# Patient Record
Sex: Female | Born: 1995 | Race: White | Hispanic: No | Marital: Single | Smoking: Never smoker
Health system: Southern US, Community
[De-identification: ages and names within clinical notes are randomized; demographics above are authoritative.]

## PROBLEM LIST (undated history)

## (undated) DIAGNOSIS — J4520 Mild intermittent asthma, uncomplicated: Secondary | ICD-10-CM

## (undated) DIAGNOSIS — F988 Other specified behavioral and emotional disorders with onset usually occurring in childhood and adolescence: Secondary | ICD-10-CM

## (undated) DIAGNOSIS — F319 Bipolar disorder, unspecified: Secondary | ICD-10-CM

## (undated) DIAGNOSIS — F32A Depression, unspecified: Secondary | ICD-10-CM

## (undated) DIAGNOSIS — F909 Attention-deficit hyperactivity disorder, unspecified type: Secondary | ICD-10-CM

## (undated) DIAGNOSIS — F329 Major depressive disorder, single episode, unspecified: Secondary | ICD-10-CM

## (undated) DIAGNOSIS — F419 Anxiety disorder, unspecified: Secondary | ICD-10-CM

## (undated) DIAGNOSIS — E119 Type 2 diabetes mellitus without complications: Secondary | ICD-10-CM

## (undated) DIAGNOSIS — I10 Essential (primary) hypertension: Secondary | ICD-10-CM

## (undated) HISTORY — DX: Morbid (severe) obesity due to excess calories: E66.01

## (undated) HISTORY — DX: Essential (primary) hypertension: I10

## (undated) HISTORY — DX: Mild intermittent asthma, uncomplicated: J45.20

## (undated) HISTORY — DX: Attention-deficit hyperactivity disorder, unspecified type: F90.9

## (undated) HISTORY — DX: Bipolar disorder, unspecified: F31.9

## (undated) HISTORY — DX: Type 2 diabetes mellitus without complications: E11.9

---
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
## 2015-01-17 ENCOUNTER — Ambulatory Visit (HOSPITAL_COMMUNITY)
Admission: RE | Admit: 2015-01-17 | Discharge: 2015-01-17 | Disposition: A | Discharge status: Home / Self Care | Payer: Medicaid Other | Source: Ambulatory Visit | Attending: Urology | Admitting: Urology

## 2015-01-29 ENCOUNTER — Other Ambulatory Visit: Payer: Self-pay | Admitting: Urology

## 2015-01-29 ENCOUNTER — Ambulatory Visit (HOSPITAL_COMMUNITY)
Admission: RE | Admit: 2015-01-29 | Discharge: 2015-01-29 | Disposition: A | Discharge status: Home / Self Care | Payer: Medicaid Other | Source: Ambulatory Visit | Attending: Urology | Admitting: Urology

## 2015-01-29 DIAGNOSIS — K769 Liver disease, unspecified: Secondary | ICD-10-CM

## 2015-02-06 ENCOUNTER — Ambulatory Visit (HOSPITAL_COMMUNITY)
Admission: RE | Admit: 2015-02-06 | Discharge: 2015-02-06 | Disposition: A | Discharge status: Home / Self Care | Payer: Medicaid Other | Source: Ambulatory Visit | Attending: Pediatrics | Admitting: Pediatrics

## 2015-02-06 DIAGNOSIS — Z0001 Encounter for general adult medical examination with abnormal findings: Secondary | ICD-10-CM | POA: Diagnosis not present

## 2015-02-06 DIAGNOSIS — K769 Liver disease, unspecified: Secondary | ICD-10-CM | POA: Diagnosis not present

## 2015-02-06 DIAGNOSIS — R6889 Other general symptoms and signs: Secondary | ICD-10-CM | POA: Insufficient documentation

## 2015-02-06 MED ORDER — GADOBENATE DIMEGLUMINE 529 MG/ML IV SOLN
20.0000 mL | Freq: Once | INTRAVENOUS | Status: AC | PRN
  Administered 2015-02-06: 20 mL via INTRAVENOUS

## 2015-05-08 ENCOUNTER — Encounter: Payer: Self-pay | Admitting: Endocrinology

## 2015-05-08 ENCOUNTER — Ambulatory Visit (INDEPENDENT_AMBULATORY_CARE_PROVIDER_SITE_OTHER): Payer: Medicaid Other | Admitting: Endocrinology

## 2015-05-08 VITALS — BP 148/97 | HR 113 | Temp 98.3°F | Ht 64.0 in | Wt 287.0 lb

## 2015-05-08 DIAGNOSIS — E119 Type 2 diabetes mellitus without complications: Secondary | ICD-10-CM | POA: Diagnosis not present

## 2015-05-08 MED ORDER — METFORMIN HCL ER 500 MG PO TB24: 2000.0000 mg | ORAL_TABLET | Freq: Every day | ORAL | Status: DC

## 2015-05-08 NOTE — Patient Instructions (Addendum)
Please come back for a follow-up appointment in 3-6 months.   The a1c of 6.4% on metformin means you do have diabetes.   Please increase the metformin.  i have sent a prescription to your pharmacy.

## 2015-05-08 NOTE — Progress Notes (Signed)
Subjective:    Patient ID: Aimee Sparks, female    DOB: 1995-12-31, 20 y.o.   MRN: [id number]  HPI  Pt returns for f/u of diabetes mellitus: DM type: 2 Dx'ed: 2016 Complications: none Therapy: metformin DKA: never Severe hypoglycemia: never Pancreatitis: never Other: she does not want a pregnancy now, but would like to preserve fertility for the future Interval history: pt states she feels well in general.  She takes metformin as rx'ed.  Past Medical History  Diagnosis Date  . ADD (attention deficit disorder)   . Depression   . Anxiety     No past surgical history on file.  Social History   Social History  . Marital Status: Single    Spouse Name: N/A  . Number of Children: N/A  . Years of Education: N/A   Occupational History  . Not on file.   Social History Main Topics  . Smoking status: Never Smoker   . Smokeless tobacco: Not on file  . Alcohol Use: No  . Drug Use: No  . Sexual Activity: Yes    Birth Control/ Protection: Pill   Other Topics Concern  . Not on file   Social History Narrative    Current Outpatient Prescriptions on File Prior to Visit  Medication Sig Dispense Refill  . acetaminophen (TYLENOL) 500 MG tablet Take 1,000 mg by mouth every 6 (six) hours as needed for pain.    . hydrOXYzine (ATARAX/VISTARIL) 10 MG tablet Take 1-3 tablets by mouth at bedtime as needed. itching  2  . lisdexamfetamine (VYVANSE) 70 MG capsule Take 70 mg by mouth every morning.    . loratadine (CLARITIN) 10 MG tablet Take 10 mg by mouth every morning.    . simethicone (MYLICON) 80 MG chewable tablet Chew 160 mg by mouth every 6 (six) hours as needed for flatulence.    . tretinoin (RETIN-A) 0.05 % cream Apply 1 application topically every morning.  2  . ziprasidone (GEODON) 40 MG capsule Take 1 capsule by mouth every evening.  3  . zonisamide (ZONEGRAN) 100 MG capsule Take 300 mg by mouth at bedtime.     . bismuth subsalicylate (PEPTO BISMOL) 262 MG chewable tablet  Chew 524 mg by mouth as needed for indigestion. Reported on 05/08/2015    . drospirenone-ethinyl estradiol (YAZ,GIANVI,LORYNA) 3-0.02 MG tablet Take 1 tablet by mouth daily. Reported on 05/08/2015    . sertraline (ZOLOFT) 100 MG tablet Take 100 mg by mouth daily. Reported on 05/08/2015     No current facility-administered medications on file prior to visit.    No Known Allergies  Family History  Problem Relation Age of Onset  . Diabetes Father     BP 148/97 mmHg  Pulse 113  Temp(Src) 98.3 F (36.8 C) (Oral)  Ht  (1.626 m)  Wt 287 lb (130.182 kg)  BMI 49.24 kg/m2  SpO2 98%  Review of Systems No weight change.  She has dry skin.      Objective:   Physical Exam VITAL SIGNS:  See vs page GENERAL: no distress NECK: acanthotic skin. Pulses: dorsalis pedis intact bilat.   MSK: no deformity of the feet CV: 1+ bilat leg edema Skin:  no ulcer on the feet, but the skin is dry.  normal color and temp on the feet. Neuro: sensation is intact to touch on the feet.    outside test results are reviewed: A1c=6.4% TSH=1  24-HR urine cortisol=17 mcg    Assessment & Plan:  DM: she needs  increased rx, if it can be done with a regimen that avoids or minimizes hypoglycemia.  Obesity, persistent.  The normal blood and urine cortisol exclude cushing's.   Patient is advised the following: Patient Instructions  Please come back for a follow-up appointment in 3-6 months.   The a1c of 6.4% on metformin means you do have diabetes.   Please increase the metformin.  i have sent a prescription to your pharmacy.

## 2015-05-10 DIAGNOSIS — E119 Type 2 diabetes mellitus without complications: Secondary | ICD-10-CM | POA: Insufficient documentation

## 2015-06-05 ENCOUNTER — Encounter: Payer: Self-pay | Admitting: Endocrinology

## 2015-09-05 ENCOUNTER — Ambulatory Visit: Payer: Medicaid Other | Admitting: Endocrinology

## 2015-09-05 DIAGNOSIS — Z0289 Encounter for other administrative examinations: Secondary | ICD-10-CM

## 2015-09-06 ENCOUNTER — Telehealth: Payer: Self-pay | Admitting: Endocrinology

## 2015-09-06 NOTE — Telephone Encounter (Signed)
Please come back for a follow-up appointment in 3 months 

## 2015-09-06 NOTE — Telephone Encounter (Signed)
Patient no showed today's appt. Please advise on how to follow up. °A. No follow up necessary. °B. Follow up urgent. Contact patient immediately. °C. Follow up necessary. Contact patient and schedule visit in ___ days. °D. Follow up advised. Contact patient and schedule visit in ____weeks. ° °

## 2015-09-07 NOTE — Telephone Encounter (Signed)
Called to R/S no answer

## 2016-02-14 IMAGING — MR MR ABDOMEN WO/W CM
12 of 21 series · 21 of 48 positions shown · IV contrast (Yes   MH)
Comparison: CT abdomen pelvis dated 11/09/2014

CLINICAL DATA: Liver lesions on CT

EXAM:
MRI ABDOMEN WITHOUT AND WITH CONTRAST
TECHNIQUE: Multiplanar multisequence MR imaging of the abdomen was performed
both before and after the administration of intravenous contrast.
CONTRAST:  20mL MULTIHANCE GADOBENATE DIMEGLUMINE 529 MG/ML IV SOLN

[Series 4: cor ssfse nav · coronal · 6.0mm · 0.78mm/px · 1 of 32 slices shown]
[im 1/32]
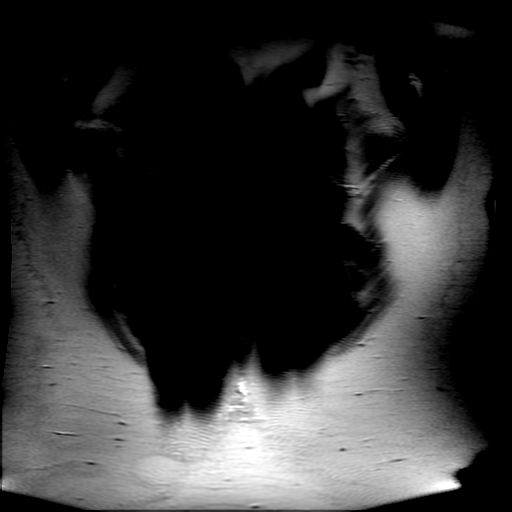

[Series 6: ax ssfse nav · axial · 6.0mm · 0.74mm/px · 1 of 38 slices shown]
[im 1/38]
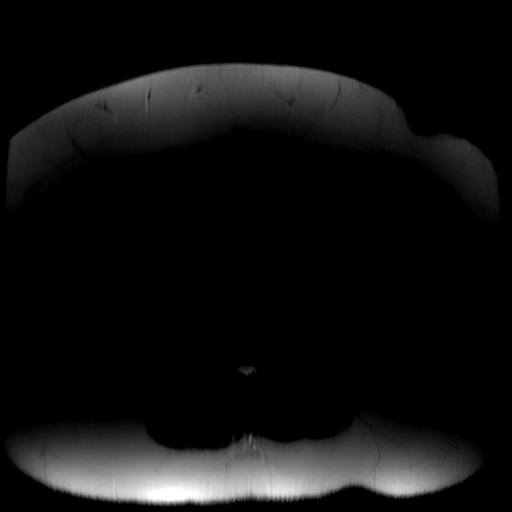

[Series 7: T2 fat-sat · axial · 6.0mm · 0.74mm/px · 1 of 19 slices shown (1 of 2)]
[im 1/19]
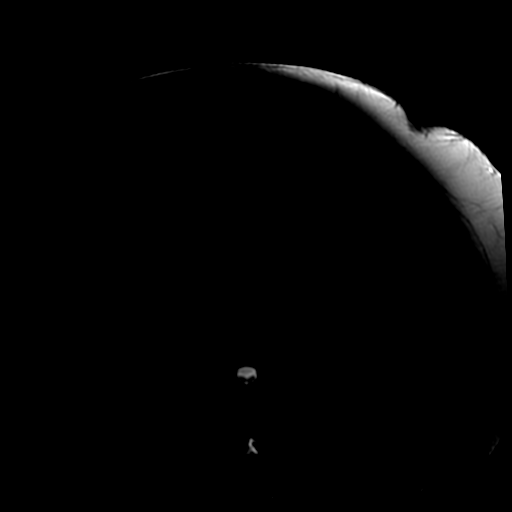

[Series 8: T2 fat-sat · axial · 6.0mm · 0.74mm/px · 1 of 39 slices shown (2 of 2)]
[im 1/39]
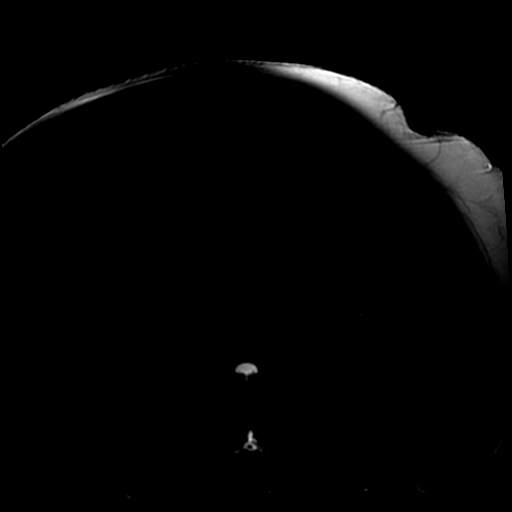

[Series 9: DWI b500 · axial · 6.0mm · 1.48mm/px · z∈[-60,+191]mm · 3 of 117 slices shown]
[im 1/117]
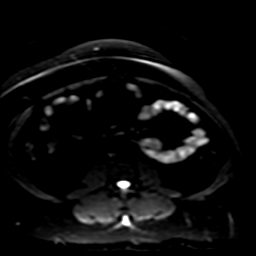
[im 59/117]
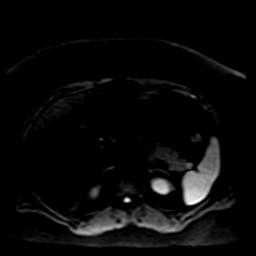
[im 117/117]
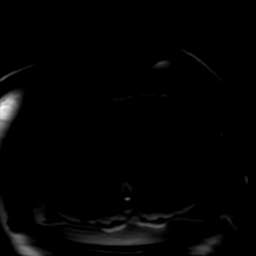

[Series 11: T1 dynamic · axial · 4.0mm · 0.78mm/px · z∈[-91,+169]mm · 2 of 66 slices shown (1 of 4)]
[im 1/66]
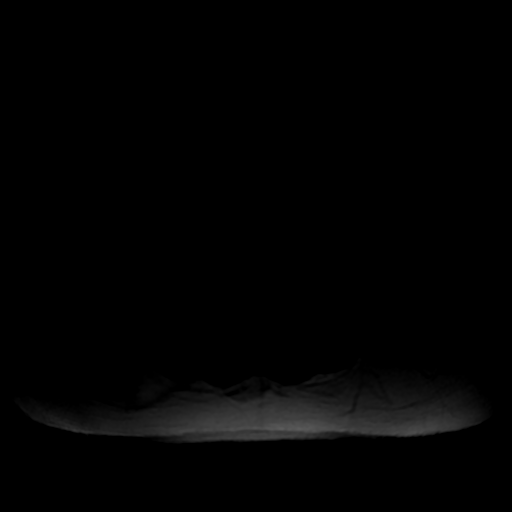
[im 66/66]
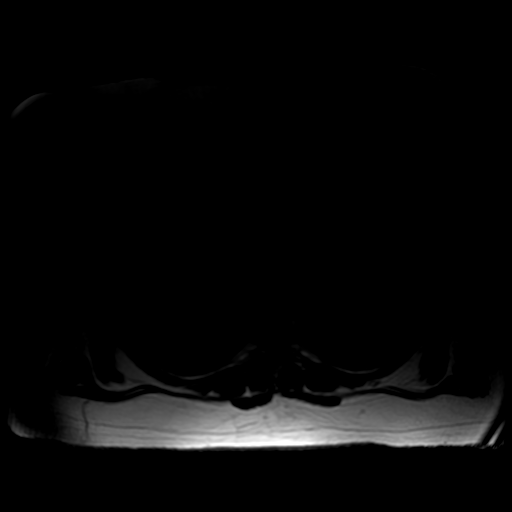

[Series 13: T1 dynamic · coronal · 4.0mm · 0.70mm/px · 1 of 56 slices shown (2 of 4)]
[im 1/56]
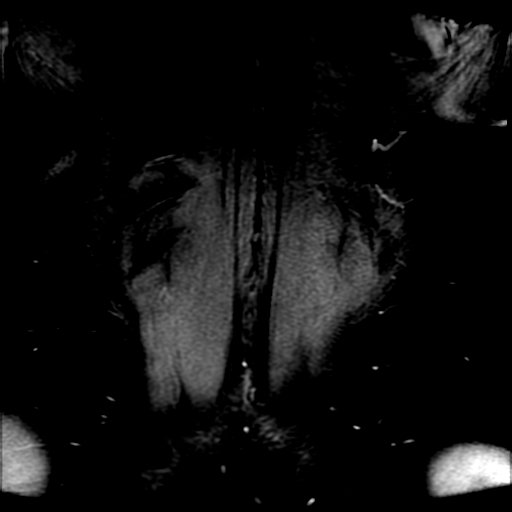

[Series 900: DWI · axial · 6.0mm · 1.48mm/px · 1 of 39 slices shown]
[im 1/39]
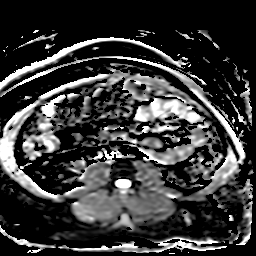

[Series 1101: T1 dynamic · axial · 4.0mm · 0.78mm/px · z∈[-91,+169]mm · 2 of 66 slices shown (3 of 4)]
[im 1/66]
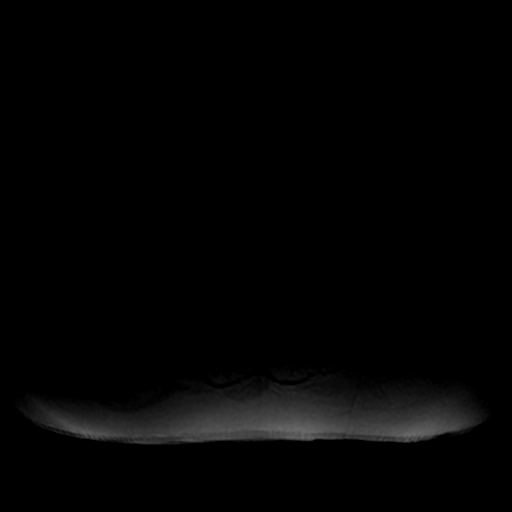
[im 66/66]
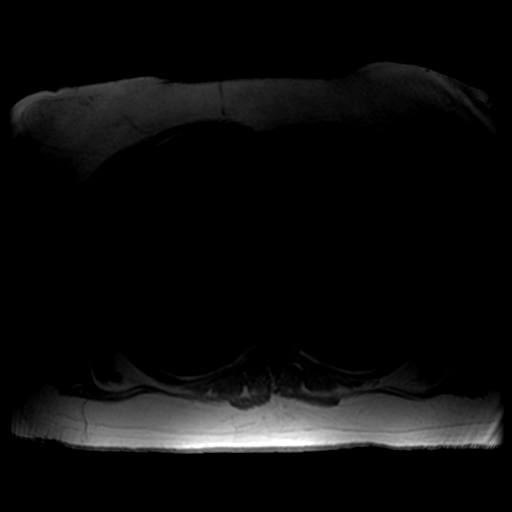

[Series 1102: T1 dynamic · axial · 4.0mm · 0.78mm/px · z∈[-91,+169]mm · 3 of 66 slices shown (4 of 4)]
[im 1/66]
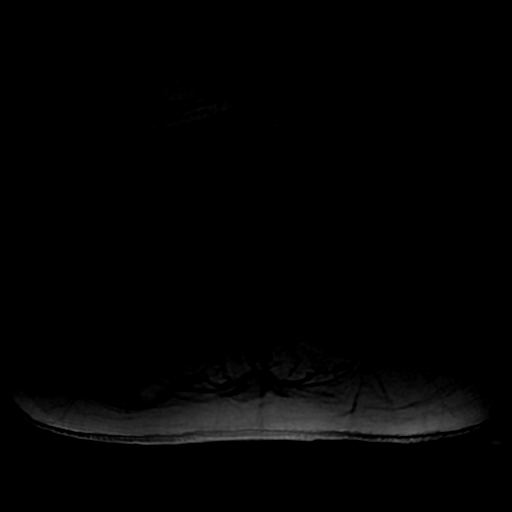
[im 33/66]
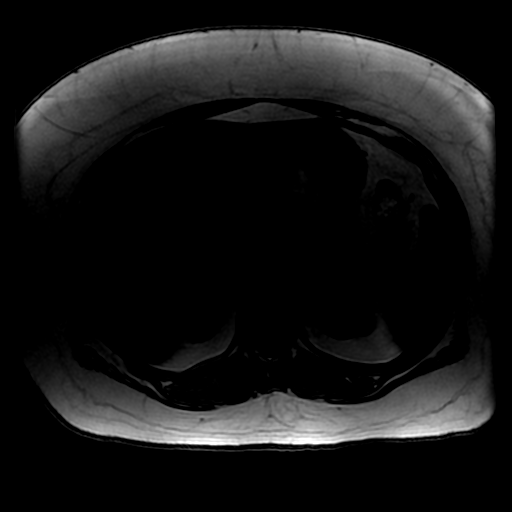
[im 66/66]
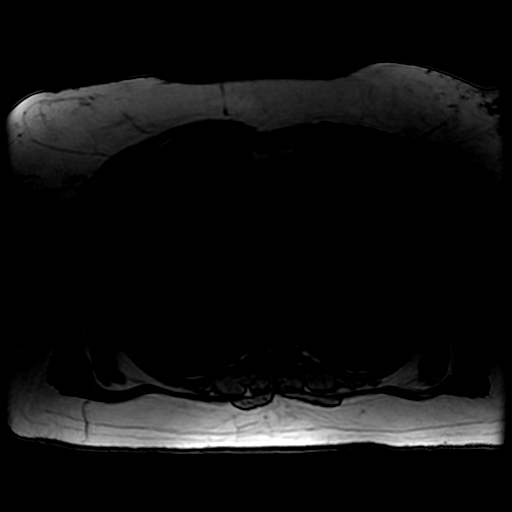

[Series 1200: T1 dynamic post-contrast · axial · non-contrast · 4.0mm · 0.78mm/px · z∈[-91,+169]mm · 3 of 66 slices shown (1 of 2)]
[im 1/66]
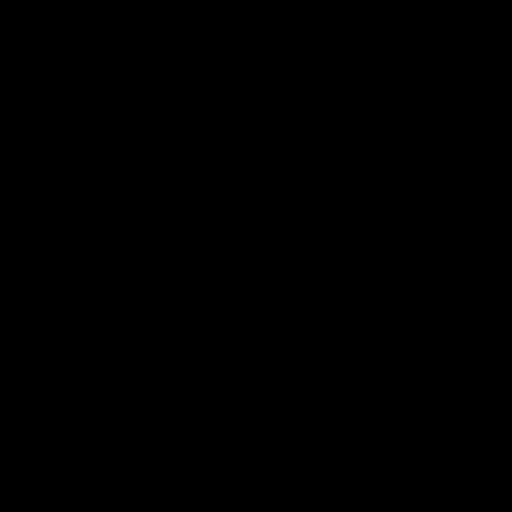
[im 33/66]
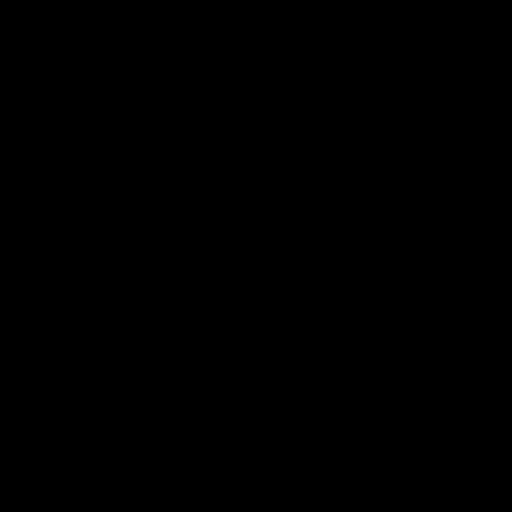
[im 66/66]
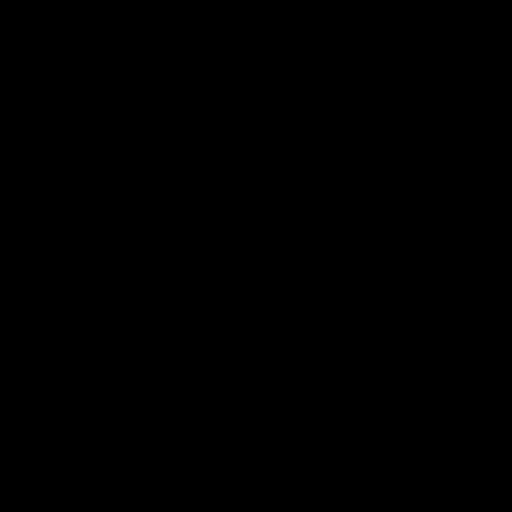

[Series 1201: T1 dynamic post-contrast · axial · non-contrast · 4.0mm · 0.78mm/px · z∈[-91,+37]mm · 2 of 66 slices shown (2 of 2)]
[im 1/66]
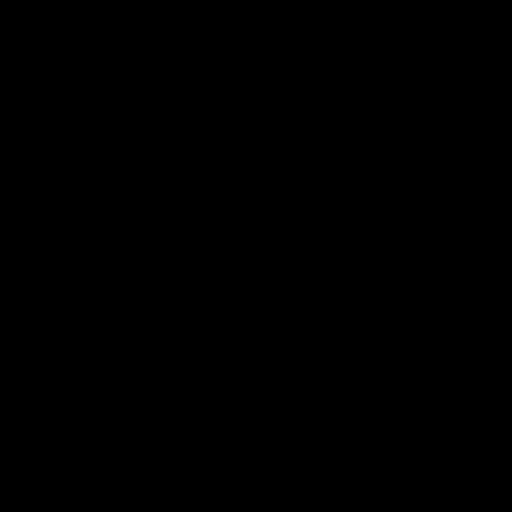
[im 33/66]
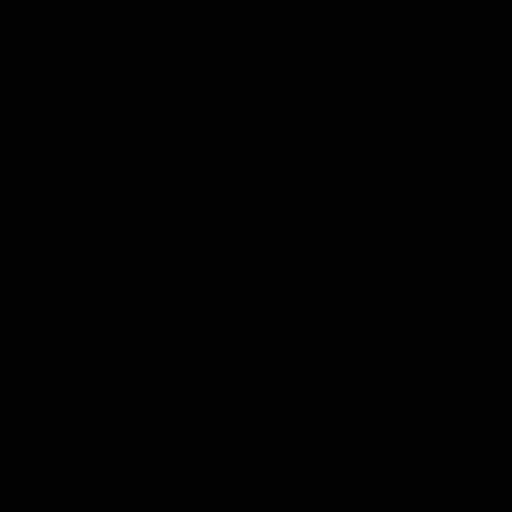

[21 of 48 positions shown; findings below may reference images not displayed]

FINDINGS: Lower chest:  Lung bases are clear.

Hepatobiliary: Severe hepatic steatosis.

Three hepatic lesions, as follows:

--17 mm enhancing lesion in segment 8 (series 2342/image 24),
corresponding to a region of focal fatty sparing, which is stealthy
on T2 (series 7/image 13), possibly reflecting a benign FNH or
adenoma

--10 mm enhancing lesion in segment 7 (series 2342/image 14),
corresponding to region of focal fatty sparing, which is moderately
conspicuous on T2 (series 8/image 7), favoring a benign adenoma,
less likely FNH

--10 mm subcapsular enhancing lesion in segment 4B (series
2342/image 35), without corresponding lesion on additional series,
possibly reflecting a perfusion anomaly

No morphologic findings of cirrhosis.

Gallbladder is within normal limits. No intrahepatic or extrahepatic
ductal dilatation.

Pancreas: Within normal limits.

Spleen: Within normal limits. Splenule in the splenic hilum adjacent
to the pancreatic tail.

Adrenals/Urinary Tract: Adrenal glands are within normal limits.

Kidneys are within normal limits.  No hydronephrosis.

Stomach/Bowel: Stomach and visualized bowel are within normal
limits.

Vascular/Lymphatic: No evidence of abdominal aortic aneurysm.

No suspicious abdominal lymphadenopathy.

Other: No abdominal ascites.

Musculoskeletal: No focal osseous lesions.
IMPRESSION: 10 mm enhancing lesion in segment 7, favored to reflect a benign
hepatic adenoma, left likely FNH.

17 mm enhancing lesion in segment 8, possibly an additional hepatic
adenoma versus FNH.

10 mm subcapsular enhancing lesion in segment 4B, favored to reflect
a perfusion anomaly.

No suspicious hepatic lesions.

If clinically warranted, consider a single follow-up MRI abdomen
with/without Eovist contrast in 6 months to help further distinguish
among these benign lesions.

## 2017-02-10 ENCOUNTER — Other Ambulatory Visit: Payer: Self-pay

## 2017-02-10 ENCOUNTER — Encounter (HOSPITAL_BASED_OUTPATIENT_CLINIC_OR_DEPARTMENT_OTHER): Payer: Self-pay | Admitting: *Deleted

## 2017-02-10 ENCOUNTER — Emergency Department (HOSPITAL_BASED_OUTPATIENT_CLINIC_OR_DEPARTMENT_OTHER)
Admission: EM | Admit: 2017-02-10 | Discharge: 2017-02-11 | Disposition: A | Discharge status: Home / Self Care | Payer: Self-pay | Attending: Emergency Medicine | Admitting: Emergency Medicine

## 2017-02-10 DIAGNOSIS — L02411 Cutaneous abscess of right axilla: Secondary | ICD-10-CM | POA: Insufficient documentation

## 2017-02-10 DIAGNOSIS — Z79899 Other long term (current) drug therapy: Secondary | ICD-10-CM | POA: Insufficient documentation

## 2017-02-10 NOTE — ED Notes (Signed)
Abscess under rt arm x 4 days

## 2017-02-10 NOTE — ED Triage Notes (Signed)
Abscess to her right axilla x 4 days.

## 2017-02-11 MED ORDER — HYDROCODONE-ACETAMINOPHEN 5-325 MG PO TABS: 1.0000 | ORAL_TABLET | ORAL | 0 refills | Status: DC | PRN

## 2017-02-11 MED ORDER — HYDROCODONE-ACETAMINOPHEN 5-325 MG PO TABS
2.0000 | ORAL_TABLET | Freq: Once | ORAL | Status: AC
  Administered 2017-02-11: 2 via ORAL
  Filled 2017-02-11: qty 2

## 2017-02-11 NOTE — ED Provider Notes (Addendum)
[facility] Provider Note: Lowella DellJ. Lane Alleyne Lac, MD, FACEP  CSN: [id number] MRN: [id number] ARRIVAL: 02/10/17 at 2032 ROOM: [facility]09/[facility]09   CHIEF COMPLAINT  Abscess   HISTORY OF PRESENT ILLNESS  02/11/17 12:30 AM Aimee Sparks is a 21 y.o. female 4-day history of a tender swollen area just distal to her right axilla.  It is pointing but not draining.  She rates her pain as a 9 out of 10, worse with palpation or movement.  She denies systemic symptoms such as fever or chills.  She has had abscesses in the past.  She has not had relief with Tylenol.  [place] substances [place] reveals the patient has received no opioid prescriptions in the past year.   Past Medical History:  Diagnosis Date  . ADD (attention deficit disorder)   . Anxiety   . Depression     History reviewed. No pertinent surgical history.  Family History  Problem Relation Age of Onset  . Diabetes Father     Social History   Tobacco Use  . Smoking status: Never Smoker  . Smokeless tobacco: Never Used  Substance Use Topics  . Alcohol use: No  . Drug use: No    Prior to Admission medications   Medication Sig Start Date End Date Taking? Authorizing Provider  acetaminophen (TYLENOL) 500 MG tablet Take 1,000 mg by mouth every 6 (six) hours as needed for pain.   Yes [provider]  hydrOXYzine (ATARAX/VISTARIL) 10 MG tablet Take 1-3 tablets by mouth at bedtime as needed. itching 09/30/14  Yes [provider]  lisdexamfetamine (VYVANSE) 70 MG capsule Take 70 mg by mouth every morning.   Yes [provider]  sertraline (ZOLOFT) 100 MG tablet Take 100 mg by mouth daily. Reported on 05/08/2015   Yes [provider]  triamcinolone cream (KENALOG) 0.1 % APPLY ON THE SKIN TWICE A DAY AS NEEDED (NOT TO EXCEED TWO WEEKS) 04/06/15  Yes [provider]  ziprasidone (GEODON) 40 MG capsule Take 1 capsule by mouth every evening.  10/06/14  Yes [provider]  zonisamide (ZONEGRAN) 100 MG capsule Take 300 mg by mouth at bedtime.    Yes [provider]  bismuth subsalicylate (PEPTO BISMOL) 262 MG chewable tablet Chew 524 mg by mouth as needed for indigestion. Reported on 05/08/2015    [provider]  clobetasol (TEMOVATE) 0.05 % external solution APPLY TO SCALP NIGHTLY THEN WASH OUT IN THE AM 04/06/15   [provider]  drospirenone-ethinyl estradiol (YAZ,GIANVI,LORYNA) 3-0.02 MG tablet Take 1 tablet by mouth daily. Reported on 05/08/2015    [provider]  loratadine (CLARITIN) 10 MG tablet Take 10 mg by mouth every morning.    [provider]  metFORMIN (GLUCOPHAGE-XR) 500 MG 24 hr tablet Take 4 tablets (2,000 mg total) by mouth daily with breakfast. 05/08/15   Romero BellingEllison, Sean, MD  simethicone (MYLICON) 80 MG chewable tablet Chew 160 mg by mouth every 6 (six) hours as needed for flatulence.    [provider]  tretinoin (RETIN-A) 0.05 % cream Apply 1 application topically every morning. 08/01/14   [provider]    Allergies Patient has no known allergies.   REVIEW OF SYSTEMS  Negative except as noted here or in the History of Present Illness.   PHYSICAL EXAMINATION  Initial Vital Signs Blood pressure (!) 148/98, pulse 98, temperature 98.8 F (37.1 C), temperature source Oral, resp. rate 20, height 5\' 3"  (1.6 m), weight 131.5  kg (290 lb), last menstrual period 01/06/2017, SpO2 98 %.  Examination General: Well-developed, well-nourished female in no acute distress; appearance consistent with age of record HENT: normocephalic; atraumatic Eyes: Normal appearance Neck: supple Heart: regular rate and rhythm Lungs: clear to auscultation bilaterally Abdomen: soft; nondistended Extremities: No deformity; full range of motion Neurologic: Awake, alert and oriented; motor function intact in all extremities and symmetric; no facial droop Skin: Warm and  dry; large, tender, pointing abscess just distal to right axilla Psychiatric: Normal mood and affect   RESULTS  Summary of this visit's results, reviewed by myself:   EKG Interpretation  Date/Time:    Ventricular Rate:    PR Interval:    QRS Duration:   QT Interval:    QTC Calculation:   R Axis:     Text Interpretation:        Laboratory Studies: No results found for this or any previous visit (from the past 24 hour(s)). Imaging Studies: No results found.  ED COURSE  Nursing notes and initial vitals signs, including pulse oximetry, reviewed.  Vitals:   02/10/17 2100 02/10/17 2104 02/10/17 2338  BP:  (!) 153/84 (!) 148/98  Pulse:  (!) 114 98  Resp:  18 20  Temp:  98.8 F (37.1 C)   TempSrc:  Oral   SpO2:  99% 98%  Weight: 131.5 kg (290 lb)    Height: 5\' 3"  (1.6 m)     Patient advised to remove the packing in 2-3 days if symptoms are improving.  If symptoms are worsening please return to the emergency department.  PROCEDURES   INCISION AND DRAINAGE Performed by: Paula LibraMOLPUS,Lani Mendiola L Consent: Verbal consent obtained. Risks and benefits: risks, benefits and alternatives were discussed Type: abscess  Body area: Just distal to right axilla  Anesthesia: local infiltration  Incision was made with a scalpel.  Local anesthetic: lidocaine 2 % with epinephrine  Anesthetic total: 4 ml  Complexity: complex Blunt dissection to break up loculations  Drainage: purulent  Drainage amount: Copious  Packing material: 1/4 in iodoform gauze  Patient tolerance: Patient tolerated the procedure well with no immediate complications.     ED DIAGNOSES     ICD-10-CM   1. Abscess of axilla, right L02.411        Paula LibraMolpus, Izumi Mixon, MD 02/11/17 0101    Paula LibraMolpus, Odilon Cass, MD 02/11/17 0105    Paula LibraMolpus, Juneau Doughman, MD 02/11/17 [phone]

## 2018-05-19 ENCOUNTER — Encounter (HOSPITAL_BASED_OUTPATIENT_CLINIC_OR_DEPARTMENT_OTHER): Payer: Self-pay

## 2018-05-19 ENCOUNTER — Other Ambulatory Visit: Payer: Self-pay

## 2018-05-19 ENCOUNTER — Emergency Department (HOSPITAL_BASED_OUTPATIENT_CLINIC_OR_DEPARTMENT_OTHER)
Admission: EM | Admit: 2018-05-19 | Discharge: 2018-05-19 | Disposition: A | Discharge status: Home / Self Care | Payer: Medicaid Other | Attending: Emergency Medicine | Admitting: Emergency Medicine

## 2018-05-19 DIAGNOSIS — L02214 Cutaneous abscess of groin: Secondary | ICD-10-CM | POA: Insufficient documentation

## 2018-05-19 DIAGNOSIS — E119 Type 2 diabetes mellitus without complications: Secondary | ICD-10-CM | POA: Insufficient documentation

## 2018-05-19 DIAGNOSIS — Z79899 Other long term (current) drug therapy: Secondary | ICD-10-CM | POA: Insufficient documentation

## 2018-05-19 MED ORDER — HYDROCODONE-ACETAMINOPHEN 5-325 MG PO TABS: 1.0000 | ORAL_TABLET | Freq: Four times a day (QID) | ORAL | 0 refills | Status: DC | PRN

## 2018-05-19 MED ORDER — CEPHALEXIN 500 MG PO CAPS: 500.0000 mg | ORAL_CAPSULE | Freq: Four times a day (QID) | ORAL | 0 refills | Status: DC

## 2018-05-19 MED ORDER — SULFAMETHOXAZOLE-TRIMETHOPRIM 800-160 MG PO TABS: 1.0000 | ORAL_TABLET | Freq: Two times a day (BID) | ORAL | 0 refills | Status: AC

## 2018-05-19 NOTE — ED Provider Notes (Signed)
[facility] EMERGENCY DEPARTMENT Provider Note   CSN: [id number] Arrival date & time: 05/19/18  1845    History   Chief Complaint Chief Complaint  Patient presents with  . Recurrent Skin Infections    HPI Aimee Sparks is a 23 y.o. female.     Patient is a 23 year old female with past medical history of ADHD, anxiety.  She presents today for evaluation of swelling and pain to the inside of the left groin.  This is been present for the past several days and is worsening.  The patient has a history of cyst/abscesses in her axilla.  She has had one previous abscess in her groin that was treated with antibiotics and resolved.  She denies any fevers or chills.  The history is provided by the patient.    Past Medical History:  Diagnosis Date  . ADD (attention deficit disorder)   . Anxiety   . Depression     Patient Active Problem List   Diagnosis Date Noted  . Diabetes (HCC) 05/10/2015    History reviewed. No pertinent surgical history.   OB History   No obstetric history on file.      Home Medications    Prior to Admission medications   Medication Sig Start Date End Date Taking? Authorizing Provider  acetaminophen (TYLENOL) 500 MG tablet Take 1,000 mg by mouth every 6 (six) hours as needed for pain.    [provider]  bismuth subsalicylate (PEPTO BISMOL) 262 MG chewable tablet Chew 524 mg by mouth as needed for indigestion. Reported on 05/08/2015    [provider]  HYDROcodone-acetaminophen (NORCO) 5-325 MG tablet Take 1 tablet by mouth every 4 (four) hours as needed (for pain). 02/11/17   Molpus, John, MD  lisdexamfetamine (VYVANSE) 70 MG capsule Take 70 mg by mouth every morning.    [provider]  sertraline (ZOLOFT) 100 MG tablet Take 100 mg by mouth daily. Reported on 05/08/2015    [provider]  ziprasidone (GEODON) 40 MG capsule Take 1 capsule by mouth every evening. 10/06/14   [provider]    zonisamide (ZONEGRAN) 100 MG capsule Take 300 mg by mouth at bedtime.     [provider]    Family History Family History  Problem Relation Age of Onset  . Diabetes Father     Social History Social History   Tobacco Use  . Smoking status: Never Smoker  . Smokeless tobacco: Never Used  Substance Use Topics  . Alcohol use: No  . Drug use: No     Allergies   Patient has no known allergies.   Review of Systems Review of Systems  All other systems reviewed and are negative.    Physical Exam Updated Vital Signs BP (!) 165/107 (BP Location: Left Arm)   Pulse (!) 125   Temp 98.6 F (37 C) (Oral)   Resp 20   Ht 5\' 3"  (1.6 m)   Wt 125.6 kg   LMP 05/07/2018   SpO2 99%   BMI 49.07 kg/m   Physical Exam Vitals signs and nursing note reviewed.  Constitutional:      Appearance: Normal appearance.  HENT:     Head: Normocephalic.  Neck:     Musculoskeletal: Normal range of motion.  Pulmonary:     Effort: Pulmonary effort is normal.  Skin:    Comments: The inside of the left groin reveals an erythematous, indurated, 3 cm round area to the inferior aspect of inside of the left  labia.  Neurological:     Mental Status: She is alert.      ED Treatments / Results  Labs (all labs ordered are listed, but only abnormal results are displayed) Labs Reviewed - No data to display  EKG None  Radiology No results found.  Procedures Procedures (including critical care time)  Medications Ordered in ED Medications - No data to display   Initial Impression / Assessment and Plan / ED Course  I have reviewed the triage vital signs and the nursing notes.  Pertinent labs & imaging results that were available during my care of the patient were reviewed by me and considered in my medical decision making (see chart for details).  Patient is declining incision and drainage.  She is aware that this may continue to worsen, rather than improve without incision.  She  will be treated with Keflex and Bactrim, warm soaks, and is to return as needed if this worsens.  Final Clinical Impressions(s) / ED Diagnoses   Final diagnoses:  None    ED Discharge Orders    None       Geoffery Lyons, MD 05/19/18 1946

## 2018-05-19 NOTE — Discharge Instructions (Signed)
Keflex and Bactrim as prescribed.  Profen 600 mg every 6 hours as needed for pain.  Hydrocodone as prescribed as needed for pain not relieved with ibuprofen.  Apply warm soaks as frequently as possible for the next several days.  Return to the emergency department if your symptoms significantly worsen or change.

## 2018-05-19 NOTE — ED Notes (Signed)
PT states understanding of care given, follow up care, and medication prescribed. PT ambulated from ED to car with a steady gait. 

## 2018-05-19 NOTE — ED Triage Notes (Signed)
C/o boil like area to vagina x 4 days -NAD-steady gait

## 2019-10-06 ENCOUNTER — Other Ambulatory Visit: Payer: Self-pay

## 2019-10-06 ENCOUNTER — Encounter: Payer: Self-pay | Admitting: Internal Medicine

## 2019-10-06 ENCOUNTER — Ambulatory Visit (INDEPENDENT_AMBULATORY_CARE_PROVIDER_SITE_OTHER): Payer: 59 | Admitting: Internal Medicine

## 2019-10-06 VITALS — BP 150/98 | HR 111 | Temp 98.5°F | Ht 63.0 in | Wt 276.1 lb

## 2019-10-06 DIAGNOSIS — F902 Attention-deficit hyperactivity disorder, combined type: Secondary | ICD-10-CM

## 2019-10-06 DIAGNOSIS — E119 Type 2 diabetes mellitus without complications: Secondary | ICD-10-CM | POA: Diagnosis not present

## 2019-10-06 DIAGNOSIS — J4520 Mild intermittent asthma, uncomplicated: Secondary | ICD-10-CM | POA: Diagnosis not present

## 2019-10-06 DIAGNOSIS — Z23 Encounter for immunization: Secondary | ICD-10-CM | POA: Diagnosis not present

## 2019-10-06 DIAGNOSIS — I10 Essential (primary) hypertension: Secondary | ICD-10-CM | POA: Diagnosis not present

## 2019-10-06 DIAGNOSIS — F319 Bipolar disorder, unspecified: Secondary | ICD-10-CM

## 2019-10-06 DIAGNOSIS — F909 Attention-deficit hyperactivity disorder, unspecified type: Secondary | ICD-10-CM | POA: Insufficient documentation

## 2019-10-06 LAB — POCT GLYCOSYLATED HEMOGLOBIN (HGB A1C): Hemoglobin A1C: 10.5 % — AB (ref 4.0–5.6)

## 2019-10-06 MED ORDER — LISINOPRIL 20 MG PO TABS: 20.0000 mg | ORAL_TABLET | Freq: Every day | ORAL | 1 refills | Status: DC

## 2019-10-06 MED ORDER — AEROCHAMBER PLUS FLO-VU MISC: 0 refills | Status: AC

## 2019-10-06 MED ORDER — ALBUTEROL SULFATE HFA 108 (90 BASE) MCG/ACT IN AERS: 2.0000 | INHALATION_SPRAY | Freq: Four times a day (QID) | RESPIRATORY_TRACT | 3 refills | Status: DC | PRN

## 2019-10-06 MED ORDER — METFORMIN HCL 1000 MG PO TABS: 1000.0000 mg | ORAL_TABLET | Freq: Two times a day (BID) | ORAL | 1 refills | Status: DC

## 2019-10-06 NOTE — Addendum Note (Signed)
Addended by: Kern Reap B on: 10/06/2019 02:35 PM   Modules accepted: Orders

## 2019-10-06 NOTE — Progress Notes (Signed)
New Patient Office Visit     This visit occurred during the SARS-CoV-2 public health emergency.  Safety protocols were in place, including screening questions prior to the visit, additional usage of staff PPE, and extensive cleaning of exam room while observing appropriate contact time as indicated for disinfecting solutions.    CC/Reason for Visit: Establish care, discuss lab abnormalities at recent GYN visit Previous PCP: None Last Visit: Unknown  HPI: Aimee Sparks is a 24 y.o. female who is coming in today for the above mentioned reasons. Past Medical History is significant for: Morbid obesity with a BMI of 48.  She is also followed by psychiatry with a diagnosis of bipolar disorder and severe anxiety/depression as well as ADHD and is prescribed Geodon, Zoloft and Adderall through them.  She recently saw GYN as she unfortunately suffered an early pregnancy miscarriage.  During that visit she was told she had an A1c of 9 and an elevated blood pressure and was asked to follow-up with me today.  She does not smoke, she drinks alcohol only occasionally, she has had no past surgical history, no known drug allergies.  Her family history significant for father with diabetes, maternal grandfather with diabetes and hypertension and a maternal grandmother with diabetes.  She is interested in bariatric surgery.   Past Medical/Surgical History: Past Medical History:  Diagnosis Date  . ADD (attention deficit disorder)   . ADHD   . Anxiety   . Bipolar 1 disorder (HCC)   . Depression   . DM (diabetes mellitus), type 2 (HCC)   . Hypertension   . Mild intermittent asthma   . Morbid obesity (HCC)     History reviewed. No pertinent surgical history.  Social History:  reports that she has never smoked. She has never used smokeless tobacco. She reports current alcohol use. She reports that she does not use drugs.  Allergies: No Known Allergies  Family History:  Family History  Problem  Relation Age of Onset  . Diabetes Father   . Diabetes Maternal Grandmother   . Diabetes Maternal Grandfather   . Hypertension Maternal Grandfather      Current Outpatient Medications:  .  amphetamine-dextroamphetamine (ADDERALL) 20 MG tablet, Take 20 mg by mouth 2 (two) times daily., Disp: , Rfl:  .  sertraline (ZOLOFT) 100 MG tablet, Take 100 mg by mouth daily., Disp: , Rfl:  .  ziprasidone (GEODON) 80 MG capsule, Take 80 mg by mouth at bedtime., Disp: , Rfl:  .  ziprasidone (GEODON) 80 MG capsule, Take 80 mg by mouth 2 (two) times daily with a meal., Disp: , Rfl:  .  albuterol (VENTOLIN HFA) 108 (90 Base) MCG/ACT inhaler, Inhale 2 puffs into the lungs every 6 (six) hours as needed for wheezing or shortness of breath., Disp: 6.7 g, Rfl: 3 .  lisinopril (ZESTRIL) 20 MG tablet, Take 1 tablet (20 mg total) by mouth daily., Disp: 90 tablet, Rfl: 1 .  metFORMIN (GLUCOPHAGE) 1000 MG tablet, Take 1 tablet (1,000 mg total) by mouth 2 (two) times daily with a meal., Disp: 180 tablet, Rfl: 1 .  Spacer/Aero-Holding Chambers (AEROCHAMBER PLUS WITH MASK) inhaler, Use with inhaler, Disp: 1 each, Rfl: 0  Review of Systems:  Constitutional: Denies fever, chills, diaphoresis, appetite change and fatigue.  HEENT: Denies photophobia, eye pain, redness, hearing loss, ear pain, congestion, sore throat, rhinorrhea, sneezing, mouth sores, trouble swallowing, neck pain, neck stiffness and tinnitus.   Respiratory: Denies SOB, DOE, cough, chest tightness,  and wheezing.  Cardiovascular: Denies chest pain, palpitations and leg swelling.  Gastrointestinal: Denies nausea, vomiting, abdominal pain, diarrhea, constipation, blood in stool and abdominal distention.  Genitourinary: Denies dysuria, urgency, frequency, hematuria, flank pain and difficulty urinating.  Endocrine: Denies: hot or cold intolerance, sweats, changes in hair or nails, polyuria, polydipsia. Musculoskeletal: Denies myalgias, back pain, joint  swelling, arthralgias and gait problem.  Skin: Denies pallor, rash and wound.  Neurological: Denies dizziness, seizures, syncope, weakness, light-headedness, numbness and headaches.  Hematological: Denies adenopathy. Easy bruising, personal or family bleeding history  Psychiatric/Behavioral: Denies suicidal ideation, mood changes, confusion, nervousness, sleep disturbance and agitation    Physical Exam: Vitals:   10/06/19 1259  BP: (!) 150/98  Pulse: (!) 111  Temp: 98.5 F (36.9 C)  TempSrc: Oral  SpO2: 98%  Weight: 276 lb 1.6 oz (125.2 kg)  Height: 5\' 3"  (1.6 m)   Body mass index is 48.91 kg/m.  Constitutional: NAD, calm, comfortable, morbidly obese Eyes: PERRL, lids and conjunctivae normal ENMT: Mucous membranes are moist.  Respiratory: clear to auscultation bilaterally, no wheezing, no crackles. Normal respiratory effort. No accessory muscle use.  Cardiovascular: Regular rate and rhythm, no murmurs / rubs / gallops. No extremity edema.  Neurologic: Grossly intact and nonfocal Psychiatric: Normal judgment and insight. Alert and oriented x 3. Normal mood.    Impression and Plan:  Type 2 diabetes mellitus without complication, without long-term current use of insulin (HCC) -New diagnosis with an A1c of 10.5 today. -Start Metformin 1000 mg twice daily. -We have talked extensively about lifestyle modifications. -I will arrange for her to go to a diabetes education class as well as a dietitian appointment.  Morbid obesity (HCC) -Discussed healthy lifestyle, including increased physical activity and better food choices to promote weight loss. -She is requesting bariatric surgery referral, I believe she will be a great candidate.  Essential hypertension  -New diagnosis, start lisinopril 20 mg daily, return-for follow-up.  Mild intermittent asthma without complication  - Plan: albuterol (VENTOLIN HFA) 108 (90 Base) MCG/ACT inhaler, Spacer/Aero-Holding Chambers (AEROCHAMBER  PLUS WITH MASK) inhaler  Bipolar 1 disorder (HCC) Attention deficit hyperactivity disorder (ADHD), combined type -Continue follow-up with psychiatry as scheduled.     Patient Instructions  -Nice seeing you today!!  -Start metformin 1000 mg twice daily.  -Start lisinopril 20 mg daily.  -Schedule follow up in 3 months for your physical. Please come in fasting that day.  -Referrals have been placed for bariatric surgery, dietitian and diabetes education.   Diabetes Mellitus and Nutrition, Adult When you have diabetes (diabetes mellitus), it is very important to have healthy eating habits because your blood sugar (glucose) levels are greatly affected by what you eat and drink. Eating healthy foods in the appropriate amounts, at about the same times every day, can help you:  Control your blood glucose.  Lower your risk of heart disease.  Improve your blood pressure.  Reach or maintain a healthy weight. Every person with diabetes is different, and each person has different needs for a meal plan. Your health care provider may recommend that you work with a diet and nutrition specialist (dietitian) to make a meal plan that is best for you. Your meal plan may vary depending on factors such as:  The calories you need.  The medicines you take.  Your weight.  Your blood glucose, blood pressure, and cholesterol levels.  Your activity level.  Other health conditions you have, such as heart or kidney disease. How do carbohydrates affect me? Carbohydrates, also called  carbs, affect your blood glucose level more than any other type of food. Eating carbs naturally raises the amount of glucose in your blood. Carb counting is a method for keeping track of how many carbs you eat. Counting carbs is important to keep your blood glucose at a healthy level, especially if you use insulin or take certain oral diabetes medicines. It is important to know how many carbs you can safely have in each  meal. This is different for every person. Your dietitian can help you calculate how many carbs you should have at each meal and for each snack. Foods that contain carbs include:  Bread, cereal, rice, pasta, and crackers.  Potatoes and corn.  Peas, beans, and lentils.  Milk and yogurt.  Fruit and juice.  Desserts, such as cakes, cookies, ice cream, and candy. How does alcohol affect me? Alcohol can cause a sudden decrease in blood glucose (hypoglycemia), especially if you use insulin or take certain oral diabetes medicines. Hypoglycemia can be a life-threatening condition. Symptoms of hypoglycemia (sleepiness, dizziness, and confusion) are similar to symptoms of having too much alcohol. If your health care provider says that alcohol is safe for you, follow these guidelines:  Limit alcohol intake to no more than 1 drink per day for nonpregnant women and 2 drinks per day for men. One drink equals 12 oz of beer, 5 oz of wine, or 1 oz of hard liquor.  Do not drink on an empty stomach.  Keep yourself hydrated with water, diet soda, or unsweetened iced tea.  Keep in mind that regular soda, juice, and other mixers may contain a lot of sugar and must be counted as carbs. What are tips for following this plan?  Reading food labels  Start by checking the serving size on the "Nutrition Facts" label of packaged foods and drinks. The amount of calories, carbs, fats, and other nutrients listed on the label is based on one serving of the item. Many items contain more than one serving per package.  Check the total grams (g) of carbs in one serving. You can calculate the number of servings of carbs in one serving by dividing the total carbs by 15. For example, if a food has 30 g of total carbs, it would be equal to 2 servings of carbs.  Check the number of grams (g) of saturated and trans fats in one serving. Choose foods that have low or no amount of these fats.  Check the number of milligrams  (mg) of salt (sodium) in one serving. Most people should limit total sodium intake to less than 2,300 mg per day.  Always check the nutrition information of foods labeled as "low-fat" or "nonfat". These foods may be higher in added sugar or refined carbs and should be avoided.  Talk to your dietitian to identify your daily goals for nutrients listed on the label. Shopping  Avoid buying canned, premade, or processed foods. These foods tend to be high in fat, sodium, and added sugar.  Shop around the outside edge of the grocery store. This includes fresh fruits and vegetables, bulk grains, fresh meats, and fresh dairy. Cooking  Use low-heat cooking methods, such as baking, instead of high-heat cooking methods like deep frying.  Cook using healthy oils, such as olive, canola, or sunflower oil.  Avoid cooking with butter, cream, or high-fat meats. Meal planning  Eat meals and snacks regularly, preferably at the same times every day. Avoid going long periods of time without eating.  Eat  foods high in fiber, such as fresh fruits, vegetables, beans, and whole grains. Talk to your dietitian about how many servings of carbs you can eat at each meal.  Eat 4-6 ounces (oz) of lean protein each day, such as lean meat, chicken, fish, eggs, or tofu. One oz of lean protein is equal to: ? 1 oz of meat, chicken, or fish. ? 1 egg. ?  cup of tofu.  Eat some foods each day that contain healthy fats, such as avocado, nuts, seeds, and fish. Lifestyle  Check your blood glucose regularly.  Exercise regularly as told by your health care provider. This may include: ? 150 minutes of moderate-intensity or vigorous-intensity exercise each week. This could be brisk walking, biking, or water aerobics. ? Stretching and doing strength exercises, such as yoga or weightlifting, at least 2 times a week.  Take medicines as told by your health care provider.  Do not use any products that contain nicotine or  tobacco, such as cigarettes and e-cigarettes. If you need help quitting, ask your health care provider.  Work with a Veterinary surgeon or diabetes educator to identify strategies to manage stress and any emotional and social challenges. Questions to ask a health care provider  Do I need to meet with a diabetes educator?  Do I need to meet with a dietitian?  What number can I call if I have questions?  When are the best times to check my blood glucose? Where to find more information:  American Diabetes Association: diabetes.org  Academy of Nutrition and Dietetics: www.eatright.[employer] of Diabetes and Digestive and Kidney Diseases (NIH): CarFlippers.tn Summary  A healthy meal plan will help you control your blood glucose and maintain a healthy lifestyle.  Working with a diet and nutrition specialist (dietitian) can help you make a meal plan that is best for you.  Keep in mind that carbohydrates (carbs) and alcohol have immediate effects on your blood glucose levels. It is important to count carbs and to use alcohol carefully. This information is not intended to replace advice given to you by your health care provider. Make sure you discuss any questions you have with your health care provider. Document Revised: 02/20/2017 Document Reviewed: 04/14/2016 Elsevier Patient Education  2020 [employer]      Chaya Jan, MD [facility]

## 2019-10-06 NOTE — Patient Instructions (Signed)
-Nice seeing you today!!  -Start metformin 1000 mg twice daily.  -Start lisinopril 20 mg daily.  -Schedule follow up in 3 months for your physical. Please come in fasting that day.  -Referrals have been placed for bariatric surgery, dietitian and diabetes education.   Diabetes Mellitus and Nutrition, Adult When you have diabetes (diabetes mellitus), it is very important to have healthy eating habits because your blood sugar (glucose) levels are greatly affected by what you eat and drink. Eating healthy foods in the appropriate amounts, at about the same times every day, can help you:  Control your blood glucose.  Lower your risk of heart disease.  Improve your blood pressure.  Reach or maintain a healthy weight. Every person with diabetes is different, and each person has different needs for a meal plan. Your health care provider may recommend that you work with a diet and nutrition specialist (dietitian) to make a meal plan that is best for you. Your meal plan may vary depending on factors such as:  The calories you need.  The medicines you take.  Your weight.  Your blood glucose, blood pressure, and cholesterol levels.  Your activity level.  Other health conditions you have, such as heart or kidney disease. How do carbohydrates affect me? Carbohydrates, also called carbs, affect your blood glucose level more than any other type of food. Eating carbs naturally raises the amount of glucose in your blood. Carb counting is a method for keeping track of how many carbs you eat. Counting carbs is important to keep your blood glucose at a healthy level, especially if you use insulin or take certain oral diabetes medicines. It is important to know how many carbs you can safely have in each meal. This is different for every person. Your dietitian can help you calculate how many carbs you should have at each meal and for each snack. Foods that contain carbs include:  Bread, cereal,  rice, pasta, and crackers.  Potatoes and corn.  Peas, beans, and lentils.  Milk and yogurt.  Fruit and juice.  Desserts, such as cakes, cookies, ice cream, and candy. How does alcohol affect me? Alcohol can cause a sudden decrease in blood glucose (hypoglycemia), especially if you use insulin or take certain oral diabetes medicines. Hypoglycemia can be a life-threatening condition. Symptoms of hypoglycemia (sleepiness, dizziness, and confusion) are similar to symptoms of having too much alcohol. If your health care provider says that alcohol is safe for you, follow these guidelines:  Limit alcohol intake to no more than 1 drink per day for nonpregnant women and 2 drinks per day for men. One drink equals 12 oz of beer, 5 oz of wine, or 1 oz of hard liquor.  Do not drink on an empty stomach.  Keep yourself hydrated with water, diet soda, or unsweetened iced tea.  Keep in mind that regular soda, juice, and other mixers may contain a lot of sugar and must be counted as carbs. What are tips for following this plan?  Reading food labels  Start by checking the serving size on the "Nutrition Facts" label of packaged foods and drinks. The amount of calories, carbs, fats, and other nutrients listed on the label is based on one serving of the item. Many items contain more than one serving per package.  Check the total grams (g) of carbs in one serving. You can calculate the number of servings of carbs in one serving by dividing the total carbs by 15. For example, if a  food has 30 g of total carbs, it would be equal to 2 servings of carbs.  Check the number of grams (g) of saturated and trans fats in one serving. Choose foods that have low or no amount of these fats.  Check the number of milligrams (mg) of salt (sodium) in one serving. Most people should limit total sodium intake to less than 2,300 mg per day.  Always check the nutrition information of foods labeled as "low-fat" or "nonfat".  These foods may be higher in added sugar or refined carbs and should be avoided.  Talk to your dietitian to identify your daily goals for nutrients listed on the label. Shopping  Avoid buying canned, premade, or processed foods. These foods tend to be high in fat, sodium, and added sugar.  Shop around the outside edge of the grocery store. This includes fresh fruits and vegetables, bulk grains, fresh meats, and fresh dairy. Cooking  Use low-heat cooking methods, such as baking, instead of high-heat cooking methods like deep frying.  Cook using healthy oils, such as olive, canola, or sunflower oil.  Avoid cooking with butter, cream, or high-fat meats. Meal planning  Eat meals and snacks regularly, preferably at the same times every day. Avoid going long periods of time without eating.  Eat foods high in fiber, such as fresh fruits, vegetables, beans, and whole grains. Talk to your dietitian about how many servings of carbs you can eat at each meal.  Eat 4-6 ounces (oz) of lean protein each day, such as lean meat, chicken, fish, eggs, or tofu. One oz of lean protein is equal to: ? 1 oz of meat, chicken, or fish. ? 1 egg. ?  cup of tofu.  Eat some foods each day that contain healthy fats, such as avocado, nuts, seeds, and fish. Lifestyle  Check your blood glucose regularly.  Exercise regularly as told by your health care provider. This may include: ? 150 minutes of moderate-intensity or vigorous-intensity exercise each week. This could be brisk walking, biking, or water aerobics. ? Stretching and doing strength exercises, such as yoga or weightlifting, at least 2 times a week.  Take medicines as told by your health care provider.  Do not use any products that contain nicotine or tobacco, such as cigarettes and e-cigarettes. If you need help quitting, ask your health care provider.  Work with a Veterinary surgeon or diabetes educator to identify strategies to manage stress and any  emotional and social challenges. Questions to ask a health care provider  Do I need to meet with a diabetes educator?  Do I need to meet with a dietitian?  What number can I call if I have questions?  When are the best times to check my blood glucose? Where to find more information:  American Diabetes Association: diabetes.org  Academy of Nutrition and Dietetics: www.eatright.[employer] of Diabetes and Digestive and Kidney Diseases (NIH): CarFlippers.tn Summary  A healthy meal plan will help you control your blood glucose and maintain a healthy lifestyle.  Working with a diet and nutrition specialist (dietitian) can help you make a meal plan that is best for you.  Keep in mind that carbohydrates (carbs) and alcohol have immediate effects on your blood glucose levels. It is important to count carbs and to use alcohol carefully. This information is not intended to replace advice given to you by your health care provider. Make sure you discuss any questions you have with your health care provider. Document Revised: 02/20/2017 Document Reviewed:  04/14/2016 Elsevier Patient Education  [place].

## 2019-10-06 NOTE — Addendum Note (Signed)
Addended by: Kern Reap B on: 10/06/2019 04:02 PM   Modules accepted: Orders

## 2019-10-15 ENCOUNTER — Encounter: Payer: Self-pay | Admitting: Internal Medicine

## 2019-11-23 ENCOUNTER — Encounter: Payer: 59 | Attending: Internal Medicine | Admitting: Skilled Nursing Facility1

## 2019-11-23 ENCOUNTER — Encounter: Payer: Self-pay | Admitting: Skilled Nursing Facility1

## 2019-11-23 ENCOUNTER — Other Ambulatory Visit: Payer: Self-pay

## 2019-11-23 DIAGNOSIS — E119 Type 2 diabetes mellitus without complications: Secondary | ICD-10-CM | POA: Diagnosis not present

## 2019-11-23 NOTE — Progress Notes (Signed)
Diabetes Self-Management Education  Visit Type: First/Initial  App  11/23/2019  Ms. [facility], identified by name and date of birth, is a 24 y.o. female with a diagnosis of Diabetes: Type 2.   ASSESSMENT  Height 5\' 3"  (1.6 m), weight 267 lb (121.1 kg). Body mass index is 47.3 kg/m.   Pt states she is thinking about doing talk therapy again.  Pts A1C 10.5. Pt states she does not check her blood sugar every stating she was not told to stating her dad checks her blood sugars every now and then: getting numbers around 173 5 hours after eating lunch. Pt took her sugar during appt: 167 having eaten about 4.5 hours ago.  Pt states she just started going to gym. Work and bills. Pt states she paints as a hobby. Pt states she is always nausea stating she does not want to eat. Pt states she likes plant based proteins. Pt states she was advised to discuss bariatric surgery: Dietitian advised we talk about diabetes today and can discuss bariatric surgery next time.   Goals: -Always bring your meter with you everywhere you go -Always Properly dispose of your needles:  -Discard in a hard plastic/metal container with a lid (something the needle can't puncture)  -Write Do Not Recycle on the outside of the container  -Example: A laundry detergent bottle -Never use the same needle more than once -A meal: carbohydrates, protein, vegetable -A snack: A Fruit OR Vegetable AND Protein  -Try to be more active -Always pay attention to your body keeping watchful of possible low blood sugar (below 70) or high blood sugar (above 200); Aiming for under 130 before you eat anything in the  morning and under 180 2 hours after eating  -Check your feet every day looking for anything that was not there the day before  -Stress Releif ideas: gym time, therapy time or meditation time or painting time  -Drink diet soda or Gatorade zero instead of regular    Diabetes Self-Management Education - 11/23/19 1408       Visit Information   Visit Type First/Initial      Initial Visit   Diabetes Type Type 2    Are you currently following a meal plan? No    Are you taking your medications as prescribed? Yes      Health Coping   How would you rate your overall health? Fair      Psychosocial Assessment   Patient Belief/Attitude about Diabetes Motivated to manage diabetes      Pre-Education Assessment   Patient understands the diabetes disease and treatment process. Needs Instruction    Patient understands incorporating nutritional management into lifestyle. Needs Instruction    Patient undertands incorporating physical activity into lifestyle. Needs Instruction    Patient understands using medications safely. Needs Instruction    Patient understands monitoring blood glucose, interpreting and using results Needs Instruction    Patient understands prevention, detection, and treatment of acute complications. Needs Instruction    Patient understands prevention, detection, and treatment of chronic complications. Needs Instruction    Patient understands how to develop strategies to address psychosocial issues. Needs Instruction    Patient understands how to develop strategies to promote health/change behavior. Needs Instruction      Complications   How often do you check your blood sugar? 1-2 times/day    Have you had a dilated eye exam in the past 12 months? No    Have you had a dental exam in the past  12 months? Yes    Are you checking your feet? Yes    How many days per week are you checking your feet? 7      Dietary Intake   Breakfast cereal or eggs    Snack (morning) yogurt or peanut butter crackers    Lunch sandwich    Snack (afternoon) cheese its    Dinner waffles + light syrup    Beverage(s) water, soda, gaotarde      Exercise   Exercise Type ADL's      Patient Education   Previous Diabetes Education No      Individualized Goals (developed by patient)   Nutrition Follow meal plan  discussed;General guidelines for healthy choices and portions discussed    Physical Activity Exercise 5-7 days per week;30 minutes per day    Medications take my medication as prescribed    Monitoring  test my blood glucose as discussed;test blood glucose pre and post meals as discussed      Post-Education Assessment   Patient understands the diabetes disease and treatment process. Demonstrates understanding / competency    Patient understands incorporating nutritional management into lifestyle. Demonstrates understanding / competency    Patient undertands incorporating physical activity into lifestyle. Demonstrates understanding / competency    Patient understands using medications safely. Demonstrates understanding / competency    Patient understands monitoring blood glucose, interpreting and using results Demonstrates understanding / competency    Patient understands prevention, detection, and treatment of acute complications. Demonstrates understanding / competency    Patient understands prevention, detection, and treatment of chronic complications. Demonstrates understanding / competency    Patient understands how to develop strategies to address psychosocial issues. Demonstrates understanding / competency    Patient understands how to develop strategies to promote health/change behavior. Demonstrates understanding / competency      Outcomes   Expected Outcomes Demonstrated interest in learning. Expect positive outcomes    Future DMSE 4-6 wks    Program Status Completed           Individualized Plan for Diabetes Self-Management Training:   Learning Objective:  Patient will have a greater understanding of diabetes self-management. Patient education plan is to attend individual and/or group sessions per assessed needs and concerns.   Plan:   There are no Patient Instructions on file for this visit.  Expected Outcomes:  Demonstrated interest in learning. Expect positive  outcomes  Education material provided: ADA - How to Thrive: A Guide for Your Journey with Diabetes, My Plate and Snack sheet  If problems or questions, patient to contact team via:  Phone  Future DSME appointment: 4-6 wks

## 2019-12-21 ENCOUNTER — Ambulatory Visit: Payer: 59 | Admitting: Skilled Nursing Facility1

## 2019-12-22 ENCOUNTER — Ambulatory Visit: Payer: 59 | Admitting: Skilled Nursing Facility1

## 2019-12-27 ENCOUNTER — Ambulatory Visit: Payer: 59 | Admitting: Skilled Nursing Facility1

## 2020-01-04 ENCOUNTER — Encounter: Payer: Self-pay | Admitting: Internal Medicine

## 2020-01-04 ENCOUNTER — Telehealth: Payer: Self-pay

## 2020-01-04 NOTE — Telephone Encounter (Signed)
Pt is asking for antibiotic to be called in due to the chronic boils she gets. Does not want to come into the office.

## 2020-01-04 NOTE — Telephone Encounter (Signed)
She will need a visit. I cannot prescribe abx without assessing the problem. Thanks.

## 2020-01-04 NOTE — Telephone Encounter (Signed)
I made a virtual appt with Dr. Selena Batten. Nothing further is needed.

## 2020-01-05 ENCOUNTER — Telehealth (INDEPENDENT_AMBULATORY_CARE_PROVIDER_SITE_OTHER): Payer: 59 | Admitting: Family Medicine

## 2020-01-05 DIAGNOSIS — R739 Hyperglycemia, unspecified: Secondary | ICD-10-CM | POA: Diagnosis not present

## 2020-01-05 DIAGNOSIS — E119 Type 2 diabetes mellitus without complications: Secondary | ICD-10-CM

## 2020-01-05 DIAGNOSIS — L0292 Furuncle, unspecified: Secondary | ICD-10-CM | POA: Diagnosis not present

## 2020-01-05 MED ORDER — DOXYCYCLINE HYCLATE 100 MG PO TABS: 100.0000 mg | ORAL_TABLET | Freq: Two times a day (BID) | ORAL | 0 refills | Status: DC

## 2020-01-05 NOTE — Progress Notes (Signed)
Virtual Visit via Video Note  I connected with Sozio  on 01/05/20 at  3:00 PM EDT by a video enabled telemedicine application and verified that I am speaking with the correct person using two identifiers.  Location patient: home, [place] Location provider:work or home office Persons participating in the virtual visit: patient, provider  I discussed the limitations of evaluation and management by telemedicine and the availability of in person appointments. The patient expressed understanding and agreed to proceed.    HPI:  Acute telemedicine visit for" Boil": -Onset: about 1.5 weeks ago (report hx of recurrent issues with this here and in her armpits) -Symptoms include: painful erythematous papule on the R [place], about the size of a quarter with a little redness around it -Denies: fevers, malaise, nausea, vomiting, skin rashes elsewhere -Has tried: antibiotics in the past for this -Pertinent past medical history: morbid obesity, DM with hyperglycemia on most recent lab check; home BSs have been in the upper 100s -Pertinent medication allergies: nkda -denies any chance of pregnancy, FDLMP 12/09/2019 -denies any liver disease  ROS: See pertinent positives and negatives per HPI.  Past Medical History:  Diagnosis Date  . ADD (attention deficit disorder)   . ADHD   . Anxiety   . Bipolar 1 disorder (HCC)   . Depression   . DM (diabetes mellitus), type 2 (HCC)   . Hypertension   . Mild intermittent asthma   . Morbid obesity (HCC)     No past surgical history on file.   Current Outpatient Medications:  .  albuterol (VENTOLIN HFA) 108 (90 Base) MCG/ACT inhaler, Inhale 2 puffs into the lungs every 6 (six) hours as needed for wheezing or shortness of breath., Disp: 6.7 g, Rfl: 3 .  amphetamine-dextroamphetamine (ADDERALL) 20 MG tablet, Take 20 mg by mouth 2 (two) times daily., Disp: , Rfl:  .  doxycycline (VIBRA-TABS) 100 MG tablet, Take 1 tablet (100 mg total) by mouth 2 (two) times  daily., Disp: 20 tablet, Rfl: 0 .  lisinopril (ZESTRIL) 20 MG tablet, Take 1 tablet (20 mg total) by mouth daily., Disp: 90 tablet, Rfl: 1 .  metFORMIN (GLUCOPHAGE) 1000 MG tablet, Take 1 tablet (1,000 mg total) by mouth 2 (two) times daily with a meal., Disp: 180 tablet, Rfl: 1 .  sertraline (ZOLOFT) 100 MG tablet, Take 100 mg by mouth daily., Disp: , Rfl:  .  Spacer/Aero-Holding Chambers (AEROCHAMBER PLUS WITH MASK) inhaler, Use with inhaler, Disp: 1 each, Rfl: 0 .  ziprasidone (GEODON) 80 MG capsule, Take 80 mg by mouth at bedtime., Disp: , Rfl:  .  ziprasidone (GEODON) 80 MG capsule, Take 80 mg by mouth 2 (two) times daily with a meal., Disp: , Rfl:   EXAM:  VITALS per patient if applicable:  GENERAL: alert, oriented, appears well and in no acute distress  HEENT: atraumatic, conjunttiva clear, no obvious abnormalities on inspection of external nose and ears  NECK: normal movements of the head and neck  LUNGS: on inspection no signs of respiratory distress, breathing rate appears normal, no obvious gross SOB, gasping or wheezing  SKIN: not examined due to sensitive area, no chaperone and per telemedicine guidelines  CV: no obvious cyanosis  MS: moves all visible extremities without noticeable abnormality  PSYCH/NEURO: pleasant and cooperative, no obvious depression or anxiety, speech and thought processing grossly intact  ASSESSMENT AND PLAN:  Discussed the following assessment and plan:  Boil  Type 2 diabetes mellitus without complication, without long-term current use of insulin (HCC)  Hyperglycemia  -we discussed possible serious and likely etiologies, options for evaluation and workup, limitations of telemedicine visit vs in person visit, treatment, treatment risks and precautions. Pt prefers to treat via telemedicine empirically rather than in person at this moment. Did let the patient know that we cannot do a thorough exam via telemedicine. She prefers to try empiric  treatment with an antibiotic. Query boil, possible infected or inflamed cyst with possible cellulitis, possible abscess, possible underlying hidradenitis given recurrent issues versus other. She opted for empiric treatment with doxycycline as she prefers to avoid I&D if possible. Advised follow-up in 5 days with PCP or urgent care. Advised if any worsening, new symptoms or if it is not improving over the next 24 hours on antibiotics, to seek in person care. Discussed options in terms of local urgent cares for weekend use or in case she cannot get in with her primary care doctor. Did assess her diabetes and she is currently hyperglycemic. Advised that poor control of her diabetes does put her at increased risk for infections and serious complications.  Scheduled follow up with PCP offered:   Sent message to schedulers to assist and advised patient to contact PCP office to schedule if does not receive call back in next 24 hours. Advised to seek prompt follow up telemedicine visit or in person care if worsening, new symptoms arise, or if is not improving with treatment. Did let this patient know that I only do telemedicine on Tuesdays and Thursdays for [place]. Advised to schedule follow up visit with PCP or [facility] if any further questions or concerns to avoid delays in care.   I discussed the assessment and treatment plan with the patient. The patient was provided an opportunity to ask questions and all were answered. The patient agreed with the plan and demonstrated an understanding of the instructions.     Terressa Koyanagi, DO

## 2020-01-05 NOTE — Patient Instructions (Signed)
-  I sent the medication(s) we discussed to your pharmacy: Meds ordered this encounter  Medications  . doxycycline (VIBRA-TABS) 100 MG tablet    Sig: Take 1 tablet (100 mg total) by mouth 2 (two) times daily.    Dispense:  20 tablet    Refill:  0    Please follow-up with your primary care doctor or with a local urgent care in 5 days. Please seek immediate in person evaluation if worsening, new symptoms or if this does not start to improve over the next 24 hours with treatment.  I hope you are feeling better soon!  Please note: I only work for [employer] [facility] on Tuesdays and Thursdays helping with telemedicine visits. If you have any further questions or concerns following this visit, please schedule a follow-up visit with your primary care doctor or seek care at another care facility if your primary care doctor is not available.

## 2020-01-11 ENCOUNTER — Encounter: Payer: 59 | Admitting: Internal Medicine

## 2020-01-26 ENCOUNTER — Telehealth: Payer: 59 | Admitting: Internal Medicine

## 2020-02-03 ENCOUNTER — Other Ambulatory Visit: Payer: Self-pay | Admitting: *Deleted

## 2020-02-03 ENCOUNTER — Telehealth (INDEPENDENT_AMBULATORY_CARE_PROVIDER_SITE_OTHER): Payer: 59 | Admitting: Internal Medicine

## 2020-02-03 DIAGNOSIS — E119 Type 2 diabetes mellitus without complications: Secondary | ICD-10-CM

## 2020-02-03 DIAGNOSIS — I10 Essential (primary) hypertension: Secondary | ICD-10-CM

## 2020-02-03 MED ORDER — LISINOPRIL 20 MG PO TABS: 20.0000 mg | ORAL_TABLET | Freq: Every day | ORAL | 1 refills | Status: DC

## 2020-02-03 MED ORDER — METFORMIN HCL 1000 MG PO TABS: 1000.0000 mg | ORAL_TABLET | Freq: Two times a day (BID) | ORAL | 1 refills | Status: DC

## 2020-02-08 ENCOUNTER — Encounter: Payer: Self-pay | Admitting: Internal Medicine

## 2020-02-09 NOTE — Telephone Encounter (Signed)
Patient is scheduled for 03/01/2020 at 8:30

## 2020-03-01 ENCOUNTER — Ambulatory Visit (INDEPENDENT_AMBULATORY_CARE_PROVIDER_SITE_OTHER): Payer: 59 | Admitting: Internal Medicine

## 2020-03-01 ENCOUNTER — Encounter: Payer: Self-pay | Admitting: Internal Medicine

## 2020-03-01 ENCOUNTER — Other Ambulatory Visit: Payer: Self-pay

## 2020-03-01 VITALS — BP 130/90 | HR 110 | Temp 97.6°F | Ht 63.0 in | Wt 263.3 lb

## 2020-03-01 DIAGNOSIS — K921 Melena: Secondary | ICD-10-CM

## 2020-03-01 DIAGNOSIS — E119 Type 2 diabetes mellitus without complications: Secondary | ICD-10-CM

## 2020-03-01 DIAGNOSIS — F319 Bipolar disorder, unspecified: Secondary | ICD-10-CM | POA: Diagnosis not present

## 2020-03-01 DIAGNOSIS — I10 Essential (primary) hypertension: Secondary | ICD-10-CM

## 2020-03-01 LAB — POCT GLYCOSYLATED HEMOGLOBIN (HGB A1C): Hemoglobin A1C: 8.1 % — AB (ref 4.0–5.6)

## 2020-03-01 MED ORDER — OZEMPIC (0.25 OR 0.5 MG/DOSE) 2 MG/1.5ML ~~LOC~~ SOPN
0.5000 mg | PEN_INJECTOR | SUBCUTANEOUS | 3 refills | Status: AC
Start: 2020-03-01 — End: ?

## 2020-03-01 MED ORDER — LISINOPRIL 40 MG PO TABS: 40.0000 mg | ORAL_TABLET | Freq: Every day | ORAL | 1 refills | Status: AC

## 2020-03-01 NOTE — Patient Instructions (Signed)
-  Nice seeing you today!!  -Lab work today; will notify you once results are available.  -Increase lisinopril to 40 mg daily.  -Start Ozempic 0.25 mg weekly for 4 weeks and then increase to 0.5 mg injected weekly.  -Schedule follow up in 3 months.

## 2020-03-01 NOTE — Progress Notes (Signed)
Established Patient Office Visit     This visit occurred during the SARS-CoV-2 public health emergency.  Safety protocols were in place, including screening questions prior to the visit, additional usage of staff PPE, and extensive cleaning of exam room while observing appropriate contact time as indicated for disinfecting solutions.    CC/Reason for Visit: Follow-up chronic medical conditions  HPI: Aimee Sparks is a 24 y.o. female who is coming in today for the above mentioned reasons. Past Medical History is significant for: I saw her as a new patient in July after visit with GYN showed her to be diabetic and hypertensive.  She also has history of morbid obesity with a BMI of 48, has a history of bipolar disorder and ADHD followed by psychiatry on Geodon, Zoloft and Adderall.  She was started on Metformin at last visit.  She is tolerating it well.  She has been able to lose 6 pounds since her last visit.  She saw a commercial for Ozempic and is interested in possibility of starting this.  She has noticed on occasion bright red blood in her toilet that is significant amounts.  She does not feel constipated, she does not feel short of breath or lightheaded.  She does not have acid reflux symptoms.  She has never noticed melena, she does not use excessive NSAIDs or drink excessive amounts of alcohol.  Past Medical/Surgical History: Past Medical History:  Diagnosis Date  . ADD (attention deficit disorder)   . ADHD   . Anxiety   . Bipolar 1 disorder (HCC)   . Depression   . DM (diabetes mellitus), type 2 (HCC)   . Hypertension   . Mild intermittent asthma   . Morbid obesity (HCC)     No past surgical history on file.  Social History:  reports that she has never smoked. She has never used smokeless tobacco. She reports current alcohol use. She reports that she does not use drugs.  Allergies: No Known Allergies  Family History:  Family History  Problem Relation Age of Onset   . Diabetes Father   . Diabetes Maternal Grandmother   . Diabetes Maternal Grandfather   . Hypertension Maternal Grandfather      Current Outpatient Medications:  .  albuterol (VENTOLIN HFA) 108 (90 Base) MCG/ACT inhaler, Inhale 2 puffs into the lungs every 6 (six) hours as needed for wheezing or shortness of breath., Disp: 6.7 g, Rfl: 3 .  amphetamine-dextroamphetamine (ADDERALL) 20 MG tablet, Take 20 mg by mouth 2 (two) times daily., Disp: , Rfl:  .  doxycycline (VIBRA-TABS) 100 MG tablet, Take 1 tablet (100 mg total) by mouth 2 (two) times daily., Disp: 20 tablet, Rfl: 0 .  metFORMIN (GLUCOPHAGE) 1000 MG tablet, Take 1 tablet (1,000 mg total) by mouth 2 (two) times daily with a meal., Disp: 180 tablet, Rfl: 1 .  sertraline (ZOLOFT) 100 MG tablet, Take 100 mg by mouth daily., Disp: , Rfl:  .  Spacer/Aero-Holding Chambers (AEROCHAMBER PLUS WITH MASK) inhaler, Use with inhaler, Disp: 1 each, Rfl: 0 .  ziprasidone (GEODON) 80 MG capsule, Take 80 mg by mouth at bedtime., Disp: , Rfl:  .  ziprasidone (GEODON) 80 MG capsule, Take 80 mg by mouth 2 (two) times daily with a meal., Disp: , Rfl:  .  lisinopril (ZESTRIL) 40 MG tablet, Take 1 tablet (40 mg total) by mouth daily., Disp: 90 tablet, Rfl: 1 .  Semaglutide,0.25 or 0.5MG /DOS, (OZEMPIC, 0.25 OR 0.5 MG/DOSE,) 2 MG/1.5ML SOPN, Inject  0.5 mg into the skin once a week., Disp: 1.5 mL, Rfl: 3  Review of Systems:  Constitutional: Denies fever, chills, diaphoresis, appetite change and fatigue.  HEENT: Denies photophobia, eye pain, redness, hearing loss, ear pain, congestion, sore throat, rhinorrhea, sneezing, mouth sores, trouble swallowing, neck pain, neck stiffness and tinnitus.   Respiratory: Denies SOB, DOE, cough, chest tightness,  and wheezing.   Cardiovascular: Denies chest pain, palpitations and leg swelling.  Gastrointestinal: Denies nausea, vomiting, abdominal pain, diarrhea, constipation, blood in stool and abdominal distention.   Genitourinary: Denies dysuria, urgency, frequency, hematuria, flank pain and difficulty urinating.  Endocrine: Denies: hot or cold intolerance, sweats, changes in hair or nails, polyuria, polydipsia. Musculoskeletal: Denies myalgias, back pain, joint swelling, arthralgias and gait problem.  Skin: Denies pallor, rash and wound.  Neurological: Denies dizziness, seizures, syncope, weakness, light-headedness, numbness and headaches.  Hematological: Denies adenopathy. Easy bruising, personal or family bleeding history  Psychiatric/Behavioral: Denies suicidal ideation, mood changes, confusion, nervousness, sleep disturbance and agitation    Physical Exam: Vitals:   03/01/20 0840  BP: 130/90  Pulse: (!) 110  Temp: 97.6 F (36.4 C)  TempSrc: Oral  SpO2: 98%  Weight: 263 lb 4.8 oz (119.4 kg)  Height: 5\' 3"  (1.6 m)    Body mass index is 46.64 kg/m.   Constitutional: NAD, calm, comfortable, obese Eyes: PERRL, lids and conjunctivae normal ENMT: Mucous membranes are moist.  Respiratory: clear to auscultation bilaterally, no wheezing, no crackles. Normal respiratory effort. No accessory muscle use.  Cardiovascular: Regular rate and rhythm, no murmurs / rubs / gallops. No extremity edema.   Neurologic: Grossly intact and nonfocal Psychiatric: Normal judgment and insight. Alert and oriented x 3. Normal mood.    Impression and Plan:  Type 2 diabetes mellitus without complication, without long-term current use of insulin (HCC) -Significant improvement with an A1c that is 8.1 today down from 10.5 in July. -I will continue maximum dose Metformin. -I will add Ozempic 0.25 mg subcutaneously weekly for 4 weeks subsequently increasing to 0.5 mg weekly. -She will continue to work on lifestyle modifications and follow-up with me in 3 months.  Morbid obesity (HCC) -Discussed healthy lifestyle, including increased physical activity and better food choices to promote weight loss. -She has been  congratulated on her weight loss success thus far.  Bipolar 1 disorder (HCC) -Mood is stable, followed by psychiatry.  Hematochezia  - Plan: Ambulatory referral to Gastroenterology, CBC with Differential/Platelet  Essential hypertension  -Blood pressure remains uncontrolled. -We have decided to increase her lisinopril from 10 to 40 mg. -She will follow-up in 3 months. -Basic metabolic profile today to follow renal function and electrolytes.    Patient Instructions  -Nice seeing you today!!  -Lab work today; will notify you once results are available.  -Increase lisinopril to 40 mg daily.  -Start Ozempic 0.25 mg weekly for 4 weeks and then increase to 0.5 mg injected weekly.  -Schedule follow up in 3 months.     August, MD [facility] at [facility]

## 2020-03-02 ENCOUNTER — Encounter: Payer: Self-pay | Admitting: Internal Medicine

## 2020-03-02 ENCOUNTER — Other Ambulatory Visit: Payer: Self-pay | Admitting: Internal Medicine

## 2020-03-02 DIAGNOSIS — E785 Hyperlipidemia, unspecified: Secondary | ICD-10-CM | POA: Insufficient documentation

## 2020-03-02 DIAGNOSIS — E1169 Type 2 diabetes mellitus with other specified complication: Secondary | ICD-10-CM

## 2020-03-02 LAB — CBC WITH DIFFERENTIAL/PLATELET
Absolute Monocytes: 655 cells/uL (ref 200–950)
Basophils Absolute: 82 cells/uL (ref 0–200)
Basophils Relative: 0.9 %
Eosinophils Absolute: 273 cells/uL (ref 15–500)
Eosinophils Relative: 3 %
HCT: 42.4 % (ref 35.0–45.0)
Hemoglobin: 14.2 g/dL (ref 11.7–15.5)
Lymphs Abs: 3758 cells/uL (ref 850–3900)
MCH: 30.6 pg (ref 27.0–33.0)
MCHC: 33.5 g/dL (ref 32.0–36.0)
MCV: 91.4 fL (ref 80.0–100.0)
MPV: 10.1 fL (ref 7.5–12.5)
Monocytes Relative: 7.2 %
Neutro Abs: 4332 cells/uL (ref 1500–7800)
Neutrophils Relative %: 47.6 %
Platelets: 267 10*3/uL (ref 140–400)
RBC: 4.64 10*6/uL (ref 3.80–5.10)
RDW: 12.4 % (ref 11.0–15.0)
Total Lymphocyte: 41.3 %
WBC: 9.1 10*3/uL (ref 3.8–10.8)

## 2020-03-02 LAB — COMPREHENSIVE METABOLIC PANEL
AG Ratio: 1.5 (calc) (ref 1.0–2.5)
ALT: 38 U/L — ABNORMAL HIGH (ref 6–29)
AST: 26 U/L (ref 10–30)
Albumin: 4.6 g/dL (ref 3.6–5.1)
Alkaline phosphatase (APISO): 35 U/L (ref 31–125)
BUN: 8 mg/dL (ref 7–25)
CO2: 22 mmol/L (ref 20–32)
Calcium: 10 mg/dL (ref 8.6–10.2)
Chloride: 101 mmol/L (ref 98–110)
Creat: 0.52 mg/dL (ref 0.50–1.10)
Globulin: 3.1 g/dL (calc) (ref 1.9–3.7)
Glucose, Bld: 179 mg/dL — ABNORMAL HIGH (ref 65–99)
Potassium: 4.7 mmol/L (ref 3.5–5.3)
Sodium: 136 mmol/L (ref 135–146)
Total Bilirubin: 0.8 mg/dL (ref 0.2–1.2)
Total Protein: 7.7 g/dL (ref 6.1–8.1)

## 2020-03-02 LAB — LIPID PANEL
Cholesterol: 198 mg/dL (ref <200)
HDL: 50 mg/dL (ref >=50)
LDL Cholesterol (Calc): 106 mg/dL (calc) — ABNORMAL HIGH
Non-HDL Cholesterol (Calc): 148 mg/dL (calc) — ABNORMAL HIGH (ref <130)
Total CHOL/HDL Ratio: 4 (calc) (ref <5.0)
Triglycerides: 316 mg/dL — ABNORMAL HIGH (ref <150)

## 2020-03-02 MED ORDER — ATORVASTATIN CALCIUM 20 MG PO TABS: 20.0000 mg | ORAL_TABLET | Freq: Every day | ORAL | 1 refills | Status: DC

## 2020-03-05 ENCOUNTER — Telehealth: Payer: Self-pay | Admitting: Internal Medicine

## 2020-03-05 NOTE — Telephone Encounter (Signed)
Patient contacted the office earlier to speak with Fleet Contras for lab results and Fleet Contras is working virtually and was not able to take the call and I told the patient that Fleet Contras will call her back.  The patient has called back since she hasn't waited for over an hour to hear from Fleet Contras and wants to speak to any nurse about her labs.  Please advise

## 2020-03-08 ENCOUNTER — Other Ambulatory Visit: Payer: Self-pay | Admitting: Internal Medicine

## 2020-03-08 DIAGNOSIS — E785 Hyperlipidemia, unspecified: Secondary | ICD-10-CM

## 2020-03-08 NOTE — Telephone Encounter (Signed)
Left message on machine for patient to return our call.  See lab result note. 

## 2020-04-26 ENCOUNTER — Telehealth: Payer: Self-pay | Admitting: *Deleted

## 2020-04-26 NOTE — Telephone Encounter (Signed)
Key [id number]  Prior auth started for :  Semaglutide,0.25 or 0.5MG /DOS, (OZEMPIC, 0.25 OR 0.5 MG/DOSE,) 2 MG/1.5ML SOPN

## 2020-05-04 NOTE — Telephone Encounter (Signed)
Prior auth approved

## 2020-05-21 ENCOUNTER — Other Ambulatory Visit: Payer: Self-pay

## 2020-05-22 ENCOUNTER — Ambulatory Visit: Payer: 59 | Admitting: Gastroenterology

## 2020-05-30 ENCOUNTER — Other Ambulatory Visit: Payer: Self-pay

## 2020-05-30 ENCOUNTER — Ambulatory Visit (INDEPENDENT_AMBULATORY_CARE_PROVIDER_SITE_OTHER): Payer: BC Managed Care – PPO | Admitting: Internal Medicine

## 2020-05-30 ENCOUNTER — Encounter: Payer: Self-pay | Admitting: Internal Medicine

## 2020-05-30 VITALS — BP 110/80 | HR 108 | Temp 98.1°F | Wt 258.9 lb

## 2020-05-30 DIAGNOSIS — I10 Essential (primary) hypertension: Secondary | ICD-10-CM | POA: Diagnosis not present

## 2020-05-30 DIAGNOSIS — J4520 Mild intermittent asthma, uncomplicated: Secondary | ICD-10-CM

## 2020-05-30 DIAGNOSIS — E119 Type 2 diabetes mellitus without complications: Secondary | ICD-10-CM | POA: Diagnosis not present

## 2020-05-30 DIAGNOSIS — E1169 Type 2 diabetes mellitus with other specified complication: Secondary | ICD-10-CM

## 2020-05-30 DIAGNOSIS — F902 Attention-deficit hyperactivity disorder, combined type: Secondary | ICD-10-CM

## 2020-05-30 DIAGNOSIS — F319 Bipolar disorder, unspecified: Secondary | ICD-10-CM

## 2020-05-30 DIAGNOSIS — E785 Hyperlipidemia, unspecified: Secondary | ICD-10-CM

## 2020-05-30 LAB — POCT GLYCOSYLATED HEMOGLOBIN (HGB A1C): Hemoglobin A1C: 9.2 % — AB (ref 4.0–5.6)

## 2020-05-30 MED ORDER — ALBUTEROL SULFATE HFA 108 (90 BASE) MCG/ACT IN AERS: 2.0000 | INHALATION_SPRAY | Freq: Four times a day (QID) | RESPIRATORY_TRACT | 3 refills | Status: AC | PRN

## 2020-05-30 MED ORDER — ATORVASTATIN CALCIUM 20 MG PO TABS: 20.0000 mg | ORAL_TABLET | Freq: Every day | ORAL | 1 refills | Status: AC

## 2020-05-30 NOTE — Progress Notes (Signed)
Established Patient Office Visit     This visit occurred during the SARS-CoV-2 public health emergency.  Safety protocols were in place, including screening questions prior to the visit, additional usage of staff PPE, and extensive cleaning of exam room while observing appropriate contact time as indicated for disinfecting solutions.    CC/Reason for Visit: 57-month follow-up chronic medical conditions  HPI: Aimee Sparks is a 25 y.o. female who is coming in today for the above mentioned reasons. Past Medical History is significant for: Not well controlled type 2 diabetes, hypertension, morbid obesity as well as bipolar disorder and ADHD followed by psychiatry.  She is on maximal dose Metformin and is supposed to be on Ozempic 0.5 mg weekly.  Her insurance changed and she has been out of of the Ozempic for about 6 weeks.  She has no acute complaints today.   Past Medical/Surgical History: Past Medical History:  Diagnosis Date  . ADD (attention deficit disorder)   . ADHD   . Anxiety   . Bipolar 1 disorder (HCC)   . Depression   . DM (diabetes mellitus), type 2 (HCC)   . Hypertension   . Mild intermittent asthma   . Morbid obesity (HCC)     No past surgical history on file.  Social History:  reports that she has never smoked. She has never used smokeless tobacco. She reports current alcohol use. She reports that she does not use drugs.  Allergies: No Known Allergies  Family History:  Family History  Problem Relation Age of Onset  . Diabetes Father   . Alcoholism Father   . Depression Father   . Diabetes Maternal Grandmother   . Diabetes Maternal Grandfather   . Hypertension Maternal Grandfather      Current Outpatient Medications:  .  amphetamine-dextroamphetamine (ADDERALL) 20 MG tablet, Take 20 mg by mouth 2 (two) times daily., Disp: , Rfl:  .  fluocinonide (LIDEX) 0.05 % external solution, Apply 1 application topically as directed., Disp: , Rfl:  .   lisinopril (ZESTRIL) 40 MG tablet, Take 1 tablet (40 mg total) by mouth daily., Disp: 90 tablet, Rfl: 1 .  metFORMIN (GLUCOPHAGE) 1000 MG tablet, Take 1 tablet (1,000 mg total) by mouth 2 (two) times daily with a meal., Disp: 180 tablet, Rfl: 1 .  sertraline (ZOLOFT) 100 MG tablet, Take 100 mg by mouth daily., Disp: , Rfl:  .  Spacer/Aero-Holding Chambers (AEROCHAMBER PLUS WITH MASK) inhaler, Use with inhaler, Disp: 1 each, Rfl: 0 .  triamcinolone (KENALOG) 0.1 %, Apply 1 application topically 3 (three) times daily., Disp: , Rfl:  .  albuterol (VENTOLIN HFA) 108 (90 Base) MCG/ACT inhaler, Inhale 2 puffs into the lungs every 6 (six) hours as needed for wheezing or shortness of breath., Disp: 6.7 g, Rfl: 3 .  atorvastatin (LIPITOR) 20 MG tablet, Take 1 tablet (20 mg total) by mouth daily., Disp: 90 tablet, Rfl: 1 .  clobetasol (TEMOVATE) 0.05 % external solution, Apply 1 application topically 2 (two) times daily. to scalp (Patient not taking: Reported on 05/30/2020), Disp: , Rfl:  .  Semaglutide,0.25 or 0.5MG /DOS, (OZEMPIC, 0.25 OR 0.5 MG/DOSE,) 2 MG/1.5ML SOPN, Inject 0.5 mg into the skin once a week. (Patient not taking: Reported on 05/30/2020), Disp: 1.5 mL, Rfl: 3 .  ziprasidone (GEODON) 80 MG capsule, Take 80 mg by mouth 2 (two) times daily with a meal., Disp: , Rfl:   Review of Systems:  Constitutional: Denies fever, chills, diaphoresis, appetite change and fatigue.  HEENT: Denies photophobia, eye pain, redness, hearing loss, ear pain, congestion, sore throat, rhinorrhea, sneezing, mouth sores, trouble swallowing, neck pain, neck stiffness and tinnitus.   Respiratory: Denies SOB, DOE, cough, chest tightness,  and wheezing.   Cardiovascular: Denies chest pain, palpitations and leg swelling.  Gastrointestinal: Denies nausea, vomiting, abdominal pain, diarrhea, constipation, blood in stool and abdominal distention.  Genitourinary: Denies dysuria, urgency, frequency, hematuria, flank pain and difficulty  urinating.  Endocrine: Denies: hot or cold intolerance, sweats, changes in hair or nails, polyuria, polydipsia. Musculoskeletal: Denies myalgias, back pain, joint swelling, arthralgias and gait problem.  Skin: Denies pallor, rash and wound.  Neurological: Denies dizziness, seizures, syncope, weakness, light-headedness, numbness and headaches.  Hematological: Denies adenopathy. Easy bruising, personal or family bleeding history  Psychiatric/Behavioral: Denies suicidal ideation, mood changes, confusion, nervousness, sleep disturbance and agitation    Physical Exam: Vitals:   05/30/20 0717  BP: 110/80  Pulse: (!) 108  Temp: 98.1 F (36.7 C)  TempSrc: Oral  SpO2: 99%  Weight: 258 lb 14.4 oz (117.4 kg)    Body mass index is 45.86 kg/m.   Constitutional: NAD, calm, comfortable, obese Eyes: PERRL, lids and conjunctivae normal ENMT: Mucous membranes are moist.  Respiratory: clear to auscultation bilaterally, no wheezing, no crackles. Normal respiratory effort. No accessory muscle use.  Cardiovascular: Regular rate and rhythm, no murmurs / rubs / gallops. No extremity edema. Neurologic: Grossly intact and nonfocal Psychiatric: Normal judgment and insight. Alert and oriented x 3. Normal mood.    Impression and Plan:  Type 2 diabetes mellitus without complication, without long-term current use of insulin (HCC)  -A1c has risen again to 9.2, unsurprisingly she has been off her Ozempic for 6 weeks. -Now she has new insurance with new benefits and she is able to resume Ozempic in addition to maximal dose Metformin. -She has been counseled on lifestyle changes. -She will return in 3 months for follow-up.  Mild intermittent asthma without complication  - Plan: albuterol (VENTOLIN HFA) 108 (90 Base) MCG/ACT inhaler -Stable, no acute issues  Hyperlipidemia associated with type 2 diabetes mellitus (HCC)  - Plan: atorvastatin (LIPITOR) 20 MG tablet -Last LDL was not at goal at 106 in  December, recheck lipids in 3 months, goal LDL less than 70.  Primary hypertension -Excellent control today on lisinopril 40 mg.  Morbid obesity (HCC) -Discussed healthy lifestyle, including increased physical activity and better food choices to promote weight loss.  Attention deficit hyperactivity disorder (ADHD), combined type Bipolar 1 disorder (HCC) -Followed by psychiatry, mood stable    Patient Instructions  -Nice seeing you today!!  -Resume Ozempic 0.5 mg injected weekly.  -Schedule follow up in 3 months. Please come in fasting for lab work.     Chaya Jan, MD [facility] at [facility]

## 2020-05-30 NOTE — Patient Instructions (Signed)
-  Nice seeing you today!!  -Resume Ozempic 0.5 mg injected weekly.  -Schedule follow up in 3 months. Please come in fasting for lab work.

## 2020-06-30 ENCOUNTER — Other Ambulatory Visit: Payer: Self-pay | Admitting: Internal Medicine

## 2020-06-30 DIAGNOSIS — E119 Type 2 diabetes mellitus without complications: Secondary | ICD-10-CM

## 2020-08-29 ENCOUNTER — Ambulatory Visit: Payer: BC Managed Care – PPO | Admitting: Internal Medicine

## 2020-09-10 ENCOUNTER — Other Ambulatory Visit: Payer: Self-pay

## 2022-01-16 ENCOUNTER — Telehealth: Payer: Self-pay

## 2022-01-16 NOTE — Telephone Encounter (Signed)
Transition Care Management Unsuccessful Follow-up Telephone Call  Date of discharge and from where:  01/15/22 from [facility] for Morbid (severe) obesity due to excess calories. Had bariatric surgery  Attempts:  1st Attempt  Reason for unsuccessful TCM follow-up call:  Left voice message  If patient returns call, please find out if she's seeing Dr Jerilee Hoh still or has a different PCP. Last OV Mar 2022. If still seeing Dr Jerilee Hoh please transfer call to [facility].

## 2022-04-15 NOTE — Progress Notes (Signed)
Appt cancelled
